# Patient Record
Sex: Female | Born: 1940 | Race: White | Hispanic: No | Marital: Married | Smoking: Former smoker
Health system: Southern US, Community
[De-identification: ages and names within clinical notes are randomized; demographics above are authoritative.]

## PROBLEM LIST (undated history)

## (undated) DIAGNOSIS — I10 Essential (primary) hypertension: Secondary | ICD-10-CM

## (undated) DIAGNOSIS — E785 Hyperlipidemia, unspecified: Secondary | ICD-10-CM

## (undated) DIAGNOSIS — Z853 Personal history of malignant neoplasm of breast: Secondary | ICD-10-CM

## (undated) HISTORY — DX: Personal history of malignant neoplasm of breast: Z85.3

## (undated) HISTORY — DX: Hyperlipidemia, unspecified: E78.5

## (undated) HISTORY — DX: Essential (primary) hypertension: I10

---

## 1978-07-28 HISTORY — PX: LAPAROSCOPIC HYSTERECTOMY: SHX1926

## 1979-07-29 HISTORY — PX: CORONARY ARTERY BYPASS GRAFT: SHX141

## 1986-07-28 HISTORY — PX: MASTECTOMY MODIFIED RADICAL: SUR848

## 2009-02-25 HISTORY — PX: MASTECTOMY: SHX3

## 2011-07-29 HISTORY — PX: BASAL CELL CARCINOMA EXCISION: SHX1214

## 2011-07-29 HISTORY — PX: RETINAL DETACHMENT SURGERY: SHX105

## 2012-01-22 DIAGNOSIS — Z85828 Personal history of other malignant neoplasm of skin: Secondary | ICD-10-CM | POA: Insufficient documentation

## 2012-04-23 DIAGNOSIS — H35379 Puckering of macula, unspecified eye: Secondary | ICD-10-CM | POA: Insufficient documentation

## 2012-10-08 LAB — LIPID PANEL
Cholesterol: 177 mg/dL (ref 0–200)
HDL: 79 mg/dL — AB (ref 35–70)
LDL Cholesterol: 87 mg/dL
Triglycerides: 56 mg/dL (ref 40–160)

## 2013-08-28 LAB — HM COLONOSCOPY: HM Colonoscopy: NORMAL

## 2013-11-03 LAB — TSH: TSH: 2.24 u[IU]/mL (ref <=5.90)

## 2013-11-03 LAB — BASIC METABOLIC PANEL
BUN: 20 mg/dL (ref 4–21)
Creatinine: 1.2 mg/dL — AB (ref <=1.1)
Glucose: 89 mg/dL

## 2014-05-17 DIAGNOSIS — I255 Ischemic cardiomyopathy: Secondary | ICD-10-CM | POA: Insufficient documentation

## 2014-05-17 DIAGNOSIS — I420 Dilated cardiomyopathy: Secondary | ICD-10-CM

## 2014-12-13 DIAGNOSIS — Z86010 Personal history of colonic polyps: Secondary | ICD-10-CM | POA: Insufficient documentation

## 2014-12-13 DIAGNOSIS — I255 Ischemic cardiomyopathy: Secondary | ICD-10-CM | POA: Insufficient documentation

## 2014-12-13 DIAGNOSIS — Z853 Personal history of malignant neoplasm of breast: Secondary | ICD-10-CM | POA: Insufficient documentation

## 2014-12-13 DIAGNOSIS — I2510 Atherosclerotic heart disease of native coronary artery without angina pectoris: Secondary | ICD-10-CM | POA: Insufficient documentation

## 2014-12-13 DIAGNOSIS — I10 Essential (primary) hypertension: Secondary | ICD-10-CM | POA: Insufficient documentation

## 2014-12-13 DIAGNOSIS — E785 Hyperlipidemia, unspecified: Secondary | ICD-10-CM | POA: Insufficient documentation

## 2014-12-13 DIAGNOSIS — N289 Disorder of kidney and ureter, unspecified: Secondary | ICD-10-CM | POA: Insufficient documentation

## 2014-12-13 DIAGNOSIS — M810 Age-related osteoporosis without current pathological fracture: Secondary | ICD-10-CM | POA: Insufficient documentation

## 2015-01-02 DIAGNOSIS — I890 Lymphedema, not elsewhere classified: Secondary | ICD-10-CM | POA: Diagnosis not present

## 2015-01-02 DIAGNOSIS — L905 Scar conditions and fibrosis of skin: Secondary | ICD-10-CM | POA: Diagnosis not present

## 2015-01-04 DIAGNOSIS — L905 Scar conditions and fibrosis of skin: Secondary | ICD-10-CM | POA: Diagnosis not present

## 2015-01-04 DIAGNOSIS — I890 Lymphedema, not elsewhere classified: Secondary | ICD-10-CM | POA: Diagnosis not present

## 2015-01-11 DIAGNOSIS — I890 Lymphedema, not elsewhere classified: Secondary | ICD-10-CM | POA: Diagnosis not present

## 2015-01-11 DIAGNOSIS — L905 Scar conditions and fibrosis of skin: Secondary | ICD-10-CM | POA: Diagnosis not present

## 2015-01-18 DIAGNOSIS — L905 Scar conditions and fibrosis of skin: Secondary | ICD-10-CM | POA: Diagnosis not present

## 2015-01-18 DIAGNOSIS — I890 Lymphedema, not elsewhere classified: Secondary | ICD-10-CM | POA: Diagnosis not present

## 2015-01-23 DIAGNOSIS — I890 Lymphedema, not elsewhere classified: Secondary | ICD-10-CM | POA: Diagnosis not present

## 2015-01-23 DIAGNOSIS — L905 Scar conditions and fibrosis of skin: Secondary | ICD-10-CM | POA: Diagnosis not present

## 2015-03-20 ENCOUNTER — Encounter: Payer: Self-pay | Admitting: Internal Medicine

## 2015-03-21 DIAGNOSIS — Z8739 Personal history of other diseases of the musculoskeletal system and connective tissue: Secondary | ICD-10-CM | POA: Diagnosis not present

## 2015-03-21 DIAGNOSIS — C50911 Malignant neoplasm of unspecified site of right female breast: Secondary | ICD-10-CM | POA: Diagnosis not present

## 2015-03-21 DIAGNOSIS — C50412 Malignant neoplasm of upper-outer quadrant of left female breast: Secondary | ICD-10-CM | POA: Diagnosis not present

## 2015-03-23 DIAGNOSIS — Z961 Presence of intraocular lens: Secondary | ICD-10-CM | POA: Diagnosis not present

## 2015-04-12 ENCOUNTER — Other Ambulatory Visit: Payer: Self-pay | Admitting: Internal Medicine

## 2015-06-08 ENCOUNTER — Encounter: Payer: Self-pay | Admitting: Internal Medicine

## 2015-06-08 ENCOUNTER — Ambulatory Visit (INDEPENDENT_AMBULATORY_CARE_PROVIDER_SITE_OTHER): Payer: Medicare PPO | Admitting: Internal Medicine

## 2015-06-08 ENCOUNTER — Other Ambulatory Visit: Payer: Self-pay | Admitting: Internal Medicine

## 2015-06-08 VITALS — BP 110/60 | HR 56 | Ht 63.0 in | Wt 150.8 lb

## 2015-06-08 DIAGNOSIS — I255 Ischemic cardiomyopathy: Secondary | ICD-10-CM

## 2015-06-08 DIAGNOSIS — I25118 Atherosclerotic heart disease of native coronary artery with other forms of angina pectoris: Secondary | ICD-10-CM

## 2015-06-08 DIAGNOSIS — N289 Disorder of kidney and ureter, unspecified: Secondary | ICD-10-CM

## 2015-06-08 DIAGNOSIS — M810 Age-related osteoporosis without current pathological fracture: Secondary | ICD-10-CM

## 2015-06-08 DIAGNOSIS — Z0000 Encounter for general adult medical examination without abnormal findings: Secondary | ICD-10-CM | POA: Diagnosis not present

## 2015-06-08 DIAGNOSIS — E785 Hyperlipidemia, unspecified: Secondary | ICD-10-CM | POA: Diagnosis not present

## 2015-06-08 DIAGNOSIS — R002 Palpitations: Secondary | ICD-10-CM

## 2015-06-08 HISTORY — DX: Palpitations: R00.2

## 2015-06-08 LAB — POCT URINALYSIS DIPSTICK
Bilirubin, UA: NEGATIVE
Blood, UA: NEGATIVE
GLUCOSE UA: NEGATIVE
KETONES UA: NEGATIVE
Leukocytes, UA: NEGATIVE
Nitrite, UA: NEGATIVE
PROTEIN UA: NEGATIVE
SPEC GRAV UA: 1.02
Urobilinogen, UA: 0.2
pH, UA: 5

## 2015-06-08 NOTE — Patient Instructions (Signed)
Health Maintenance  Topic Date Due  . INFLUENZA VACCINE  07/28/2018 (Originally 02/26/2015)  . ZOSTAVAX  07/28/2018 (Originally 11/24/2002)  . TETANUS/TDAP  07/28/2018 (Originally 11/23/1961)  . PNA vac Low Risk Adult (1 of 2 - PCV13) 07/28/2018 (Originally 11/24/2007)  . COLONOSCOPY  08/29/2023  . DEXA SCAN  Addressed

## 2015-06-08 NOTE — Progress Notes (Signed)
Patient: Kristen Blanchard, Female    DOB: [birth year], 74 y.o.   MRN: [id number] Visit Date: 06/08/2015  Today's Provider: Bari Edward, MD   Chief Complaint  Patient presents with  . Medicare Wellness   Subjective:    Annual wellness visit Kristen Blanchard is a 74 y.o. female who presents today for her Subsequent Annual Wellness Visit. She feels fairly well. She reports exercising none. She reports she is sleeping well.   ----------------------------------------------------------- Hypertension This is a chronic problem. The current episode started more than 1 year ago. The problem is unchanged. The problem is controlled. Associated symptoms include shortness of breath. Pertinent negatives include no chest pain, headaches or palpitations. There are no associated agents to hypertension. The current treatment provides significant improvement. There are no compliance problems.  Hypertensive end-organ damage includes kidney disease.  Hyperlipidemia This is a chronic problem. The current episode started more than 1 year ago. The problem is controlled. Associated symptoms include shortness of breath. Pertinent negatives include no chest pain or myalgias. Current antihyperlipidemic treatment includes statins. The current treatment provides significant improvement of lipids. There are no compliance problems.    Breast cancer- Patient has a history of bilateral breast cancer. First  mastectomy on the right 20 years ago. Cancer of the left breast 6 years ago. She is status post chemotherapy and radiation. She is on Arimidex; oncologist wants her to remain on medication for 10 years. She has side effects of hair loss, fatigue, hot flashes and sweats. Osteoporosis - Patient has been on Fosamax calcium and vitamin D for a number of years. Her last bone density was about 10 years ago. She has had several minor falls without fractures. 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 She has not required the use of sublingual nitroglycerin. She previously had an AICD but that was removed for radiation therapy. She sees her cardiologist on a regular basis and has had no recent medication changes. Her activity level remains limited due to fatigue.   . Review of Systems  Constitutional: Positive for diaphoresis and fatigue. Negative for fever and chills.  HENT: Negative for hearing loss, trouble swallowing and voice change.   Eyes: Negative for visual disturbance.  Respiratory: Positive for chest tightness and shortness of breath. Negative for cough and wheezing.   Cardiovascular: Negative for chest pain, palpitations and leg swelling.  Gastrointestinal: Positive for constipation and abdominal distention (after eating). Negative for nausea, vomiting and abdominal pain.  Endocrine: Negative for polydipsia and polyuria.  Genitourinary: Positive for urgency. Negative for dysuria, hematuria, flank pain, vaginal bleeding, vaginal discharge and vaginal pain.  Musculoskeletal: Positive for arthralgias. Negative for myalgias, joint swelling and gait problem.  Skin: Negative for color change and rash.       Hair loss   Allergic/Immunologic: Negative for food allergies.  Neurological: Negative for light-headedness, numbness and headaches.  Hematological: Negative for adenopathy. Does not bruise/bleed easily.  Psychiatric/Behavioral: Positive for dysphoric mood. Negative for sleep disturbance and decreased concentration.    Social History   Social History  . Marital Status: Married    Spouse Name: N/A  . Number of Children: N/A  . Years of Education: N/A   Occupational History  . Not on file.   Social History Main Topics  . Smoking status: Former Games developer  . Smokeless tobacco: Not on file  . Alcohol Use: No  . Drug Use: Not on file  . Sexual Activity: Not on file  Other Topics Concern  . Not on file   Social History Narrative     Patient Active Problem List   Diagnosis Date Noted  . Awareness of heartbeats 06/08/2015  . 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 Impaired renal function 12/13/2014  . Dyslipidemia 12/13/2014  . Essential (primary) hypertension 12/13/2014  . H/O adenomatous polyp of colon 12/13/2014  . H/O malignant neoplasm of breast 12/13/2014  . Cardiomyopathy, ischemic 12/13/2014  . OP (osteoporosis) 12/13/2014  . 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 Retinal detachment surgery  07/2011  . Mastectomy modified radical Right 07/1986  . Laparoscopic hysterectomy    . Coronary artery bypass graft  07/1979  . Basal cell carcinoma excision  07/2011  . Mastectomy Left 02/2009    Her family history includes Cancer in her father and mother.    Previous Medications   ALENDRONATE (FOSAMAX) 70 MG TABLET    TAKE 1 TABLET BY MOUTH EVERY WEEK   ANASTROZOLE (ARIMIDEX) 1 MG TABLET    Take 1 tablet by mouth daily.   ASPIRIN 81 MG CHEWABLE TABLET    Chew 1 tablet by mouth daily.   CALCIUM-VITAMIN D-VITAMIN K [phone] MG-UNT-MCG CHEW    Chew 1 tablet by mouth daily.   LISINOPRIL (PRINIVIL,ZESTRIL) 20 MG TABLET    Take 1 tablet by mouth daily.   METOPROLOL TARTRATE (LOPRESSOR) 25 MG TABLET    Take 1 tablet by mouth 2 (two) times daily.   METRONIDAZOLE (METROCREAM) 0.75 % CREAM    APPLY TOPICALLY TWO (2) TIMES A DAY.   NITROGLYCERIN (NITROSTAT) 0.4 MG SL TABLET    Place 1 tablet under the tongue as needed.   OXISTAT 1 % LOTION    APPLY TWICE A DAY TO NAILS   QUINIDINE SULFATE 300 MG TABLET    Take 1 tablet by mouth 2 (two) times daily.   ROSUVASTATIN (CRESTOR) 5 MG TABLET    Take 1 tablet by mouth 3 (three) times a week.     No care team member to display     Objective:   Vitals: BP 110/60 mmHg  Pulse 56  Ht 5\' 3"  (1.6 m)  Wt 150 lb 12.8 oz (68.402 kg)  BMI 26.72 kg/m2  Physical Exam  Constitutional: She is  oriented to person, place, and time. She appears well-developed. No distress.  HENT:  Head: Normocephalic and atraumatic.  Eyes: Conjunctivae are normal. Right eye exhibits no discharge. Left eye exhibits no discharge. No scleral icterus.  Neck: Normal range of motion. Neck supple. No thyromegaly present.  Cardiovascular: Normal rate, regular rhythm, normal heart sounds and intact distal pulses.   Pulmonary/Chest: Effort normal and breath sounds normal. No respiratory distress. She has no wheezes.  Bilateral mastectomies. Right mastectomy with reconstruction. Port-A-Cath site right upper chest. Surgical scar from AICD removal left upper chest. No skin change to suggest cancer recurrence.  Musculoskeletal: Normal range of motion. She exhibits no edema or tenderness.  Lymphadenopathy:    She has no cervical adenopathy.    She has no axillary adenopathy.  Neurological: She is alert and oriented to person, place, and time. She has normal reflexes.  Skin: Skin is warm and dry. No rash noted.  Psychiatric: She has a normal mood and affect. Her behavior is normal. Thought content normal.    Activities of Daily Living In your present state of health, do you have any difficulty performing the following activities: 06/08/2015  Hearing? N  Vision? N  Difficulty concentrating or making decisions? N  Walking or climbing stairs? Y  Dressing or bathing? N  Doing errands, shopping? N    Fall Risk Assessment Fall Risk  06/08/2015  Falls in the past year? Yes  Number falls in past yr: 2 or more  Injury with Fall? Yes  Follow up Education provided     Patient reports there are safety devices in place in shower at home.   Depression Screen PHQ 2/9 Scores 06/08/2015  PHQ - 2 Score 6  PHQ- 9 Score 11    Cognitive Testing - 6-CIT   Correct? Score   What year is it? yes 0 Yes = 0    No = 4  What month is it? yes 0 Yes = 0    No = 3  Remember:     [place]ins, [address], [place]     What  time is it? yes 0 Yes = 0    No = 3  Count backwards from 20 to 1 yes 0 Correct = 0    1 error = 2   More than 1 error = 4  Say the months of the year in reverse. yes 0 Correct = 0    1 error = 2   More than 1 error = 4  What address did I ask you to remember? yes 0 Correct = 0  1 error = 2    2 error = 4    3 error = 6    4 error = 8    All wrong = 10       TOTAL SCORE  0/28   Interpretation:  Normal  Normal (0-7) Abnormal (8-28)        Assessment & Plan:     Annual Wellness Visit  Reviewed patient's Family Medical History Reviewed and updated list of patient's medical providers Assessment of cognitive impairment was done Assessed patient's functional ability Established a written schedule for health screening services Health Risk Assessent Completed and Reviewed  Exercise Activities and Dietary recommendations Goals    . Exercise 3x per week (30 min per time)     Patient wants to find some activities outside the house.        There is no immunization history on file for this patient.  Health Maintenance  Topic Date Due  . INFLUENZA VACCINE  07/28/2018 (Originally 02/26/2015)  . ZOSTAVAX  07/28/2018 (Originally 11/24/2002)  . TETANUS/TDAP  07/28/2018 (Originally 11/23/1961)  . PNA vac Low Risk Adult (1 of 2 - PCV13) 07/28/2018 (Originally 11/24/2007)  . COLONOSCOPY  08/29/2023  . DEXA SCAN  Addressed     Discussed health benefits of physical activity, and encouraged her to engage in regular exercise appropriate for her age and condition.    ------------------------------------------------------------------------------------------------------------  1. Medicare annual wellness visit, subsequent Medicare wellness measures are satisfied Patient is not a candidate for mammograms due to bilateral mastectomies Patient declines all vaccines We discussed mood disorder and positive depression screen - most of this is due to social isolation and medical issues; will not  prescribe medication at this time  - POCT urinalysis dipstick  2. Cardiomyopathy, ischemic Stable; followed by cardiology AICD was removed for chemotherapy and radiation with no plans to replace it at this time - CBC with Differential/Platelet - TSH  3. Coronary artery disease of native artery of native heart with stable angina pectoris (HCC) Stable with minimal anginal symptoms not requiring nitroglycerin  therapy  4. Impaired renal function Continue avoidance of nonsteroidals for arthritis or headache Take Tylenol - Comprehensive metabolic panel  5. Dyslipidemia Continue statin therapy - Lipid panel  6. OP (osteoporosis) DEXA bone density test ordered at [facility] Continue Fosamax, calcium and vitamin D   Bari Edward, MD [facility]  06/08/2015

## 2015-06-09 LAB — COMPREHENSIVE METABOLIC PANEL
ALK PHOS: 47 IU/L (ref 39–117)
ALT: 12 IU/L (ref 0–32)
AST: 16 IU/L (ref 0–40)
Albumin/Globulin Ratio: 2.2 (ref 1.1–2.5)
Albumin: 4.2 g/dL (ref 3.5–4.8)
BILIRUBIN TOTAL: 0.5 mg/dL (ref 0.0–1.2)
BUN/Creatinine Ratio: 15 (ref 11–26)
BUN: 13 mg/dL (ref 8–27)
CO2: 26 mmol/L (ref 18–29)
Calcium: 9.8 mg/dL (ref 8.7–10.3)
Chloride: 98 mmol/L (ref 97–106)
Creatinine, Ser: 0.84 mg/dL (ref 0.57–1.00)
GFR calc Af Amer: 80 mL/min/{1.73_m2} (ref >59)
GFR calc non Af Amer: 70 mL/min/{1.73_m2} (ref >59)
GLOBULIN, TOTAL: 1.9 g/dL (ref 1.5–4.5)
GLUCOSE: 75 mg/dL (ref 65–99)
POTASSIUM: 4.3 mmol/L (ref 3.5–5.2)
SODIUM: 140 mmol/L (ref 136–144)
Total Protein: 6.1 g/dL (ref 6.0–8.5)

## 2015-06-09 LAB — CBC WITH DIFFERENTIAL/PLATELET
BASOS ABS: 0 10*3/uL (ref 0.0–0.2)
Basos: 0 %
EOS (ABSOLUTE): 0 10*3/uL (ref 0.0–0.4)
Eos: 0 %
HEMATOCRIT: 41 % (ref 34.0–46.6)
Hemoglobin: 14.1 g/dL (ref 11.1–15.9)
Immature Grans (Abs): 0 10*3/uL (ref 0.0–0.1)
Immature Granulocytes: 0 %
LYMPHS ABS: 1.4 10*3/uL (ref 0.7–3.1)
Lymphs: 26 %
MCH: 29.4 pg (ref 26.6–33.0)
MCHC: 34.4 g/dL (ref 31.5–35.7)
MCV: 85 fL (ref 79–97)
Monocytes Absolute: 0.5 10*3/uL (ref 0.1–0.9)
Monocytes: 8 %
NEUTROS ABS: 3.5 10*3/uL (ref 1.4–7.0)
Neutrophils: 66 %
Platelets: 175 10*3/uL (ref 150–379)
RBC: 4.8 x10E6/uL (ref 3.77–5.28)
RDW: 13.9 % (ref 12.3–15.4)
WBC: 5.4 10*3/uL (ref 3.4–10.8)

## 2015-06-09 LAB — LIPID PANEL
Chol/HDL Ratio: 2.2 ratio units (ref 0.0–4.4)
Cholesterol, Total: 195 mg/dL (ref 100–199)
HDL: 88 mg/dL (ref >39)
LDL Calculated: 92 mg/dL (ref 0–99)
Triglycerides: 75 mg/dL (ref 0–149)

## 2015-06-09 LAB — TSH: TSH: 1.9 u[IU]/mL (ref 0.450–4.500)

## 2015-06-20 DIAGNOSIS — E2839 Other primary ovarian failure: Secondary | ICD-10-CM | POA: Diagnosis not present

## 2015-06-20 DIAGNOSIS — M8589 Other specified disorders of bone density and structure, multiple sites: Secondary | ICD-10-CM | POA: Diagnosis not present

## 2015-06-20 DIAGNOSIS — C50412 Malignant neoplasm of upper-outer quadrant of left female breast: Secondary | ICD-10-CM | POA: Diagnosis not present

## 2015-06-20 DIAGNOSIS — Z95828 Presence of other vascular implants and grafts: Secondary | ICD-10-CM | POA: Diagnosis not present

## 2015-06-20 DIAGNOSIS — Z853 Personal history of malignant neoplasm of breast: Secondary | ICD-10-CM | POA: Diagnosis not present

## 2015-06-20 DIAGNOSIS — Z170 Estrogen receptor positive status [ER+]: Secondary | ICD-10-CM | POA: Diagnosis not present

## 2015-06-20 DIAGNOSIS — C50911 Malignant neoplasm of unspecified site of right female breast: Secondary | ICD-10-CM | POA: Diagnosis not present

## 2015-06-27 ENCOUNTER — Encounter: Payer: Self-pay | Admitting: Internal Medicine

## 2015-06-27 NOTE — Progress Notes (Unsigned)
Bone density scan shows normal bone density at the spine with only osteopenia at the other sites. This is an improvement from the last scan in 2008. Continue Fosamax and calcium and vitamin D.

## 2015-07-13 NOTE — Progress Notes (Signed)
Patient informed.dr 

## 2015-12-03 DIAGNOSIS — I340 Nonrheumatic mitral (valve) insufficiency: Secondary | ICD-10-CM | POA: Insufficient documentation

## 2015-12-26 ENCOUNTER — Other Ambulatory Visit: Payer: Self-pay | Admitting: Internal Medicine

## 2015-12-26 ENCOUNTER — Telehealth: Payer: Self-pay

## 2015-12-26 NOTE — Telephone Encounter (Signed)
Patient wants medicine called in for cough. I advised her to make appointment and she asked front staff again to call in med since she can not get in today. I advised [facility] if she can not wait but since she has not been in since Nov 2016 I declined us sending in RX and advised she needs OV.

## 2016-03-20 DIAGNOSIS — D649 Anemia, unspecified: Secondary | ICD-10-CM | POA: Insufficient documentation

## 2016-05-02 ENCOUNTER — Other Ambulatory Visit: Payer: Self-pay | Admitting: Internal Medicine

## 2016-06-13 ENCOUNTER — Encounter: Payer: Self-pay | Admitting: Internal Medicine

## 2016-06-13 ENCOUNTER — Ambulatory Visit (INDEPENDENT_AMBULATORY_CARE_PROVIDER_SITE_OTHER): Payer: Medicare Other | Admitting: Internal Medicine

## 2016-06-13 VITALS — BP 122/80 | HR 58 | Resp 16 | Ht 63.0 in | Wt 147.0 lb

## 2016-06-13 DIAGNOSIS — Z853 Personal history of malignant neoplasm of breast: Secondary | ICD-10-CM | POA: Diagnosis not present

## 2016-06-13 DIAGNOSIS — E785 Hyperlipidemia, unspecified: Secondary | ICD-10-CM

## 2016-06-13 DIAGNOSIS — N183 Chronic kidney disease, stage 3 unspecified: Secondary | ICD-10-CM

## 2016-06-13 DIAGNOSIS — Z0000 Encounter for general adult medical examination without abnormal findings: Secondary | ICD-10-CM

## 2016-06-13 DIAGNOSIS — I255 Ischemic cardiomyopathy: Secondary | ICD-10-CM

## 2016-06-13 DIAGNOSIS — I890 Lymphedema, not elsewhere classified: Secondary | ICD-10-CM

## 2016-06-13 DIAGNOSIS — N1832 Chronic kidney disease, stage 3b: Secondary | ICD-10-CM | POA: Insufficient documentation

## 2016-06-13 DIAGNOSIS — I10 Essential (primary) hypertension: Secondary | ICD-10-CM | POA: Diagnosis not present

## 2016-06-13 LAB — POCT URINALYSIS DIPSTICK
Bilirubin, UA: NEGATIVE
Blood, UA: NEGATIVE
GLUCOSE UA: NEGATIVE
KETONES UA: NEGATIVE
LEUKOCYTES UA: NEGATIVE
Nitrite, UA: NEGATIVE
PROTEIN UA: NEGATIVE
SPEC GRAV UA: 1.01
Urobilinogen, UA: 0.2
pH, UA: 6

## 2016-06-13 NOTE — Progress Notes (Signed)
Patient: Kristen Blanchard, Female    DOB: [birth year], 75 y.o.   MRN: [id number] Visit Date: 06/13/2016  Today's Provider: Bari EdwardLaura Hildagarde Holleran, MD   Chief Complaint  Patient presents with  . Medicare Wellness   Subjective:    Annual wellness visit Kristen Blanchard is a 75 y.o. female who presents today for her Subsequent Annual Wellness Visit. She feels fairly well. She reports exercising none. She reports she is sleeping poorly - due to back pain sleeps in the recliner. She has general aches and pains for which she takes tylenol.  ----------------------------------------------------------- Hyperlipidemia  This is a chronic problem. The problem is controlled. Recent lipid tests were reviewed and are normal. Associated symptoms include shortness of breath. Pertinent negatives include no chest pain. Current antihyperlipidemic treatment includes statins. The current treatment provides significant improvement of lipids. There are no compliance problems.   Cardiomyopathy - recent ECHO showed mod TR and mild MR/PR. Spironolactone added.  EF 20%.  Exercise tolerance is poor. She is followed by Dr. Sol BlazingHenke. Lymphedema/Breast cancer - has persistent mild edema of left arm. She wears a compression sleeve most of the time.  She has noticed lumps that come and go in her arm.  No redness or fever noted. Seen recently by Oncology - GFR was reduced and needs repeat. Osteoporosis - she is on Fosamax without side effects.  She has chronic mid back pain for which she takes tylenol.  Review of Systems  Constitutional: Negative for chills, fatigue and fever.  HENT: Negative for congestion, hearing loss, tinnitus, trouble swallowing and voice change.   Eyes: Negative for visual disturbance.  Respiratory: Positive for shortness of breath. Negative for cough, chest tightness and wheezing.   Cardiovascular: Negative for chest pain, palpitations and leg swelling.       Lymphedema in left arm  Gastrointestinal:  Positive for abdominal distention and nausea. Negative for abdominal pain, constipation, diarrhea and vomiting.  Endocrine: Negative for polydipsia and polyuria.  Genitourinary: Positive for vaginal discharge. Negative for dysuria, frequency, genital sores and vaginal bleeding.  Musculoskeletal: Positive for arthralgias. Negative for gait problem and joint swelling.  Skin: Negative for color change and rash.  Neurological: Negative for dizziness, tremors, light-headedness and headaches.  Hematological: Negative for adenopathy. Does not bruise/bleed easily.  Psychiatric/Behavioral: Negative for dysphoric mood and sleep disturbance. The patient is not nervous/anxious.     Social History   Social History  . Marital status: Married    Spouse name: N/A  . Number of children: N/A  . Years of education: N/A   Occupational History  . Not on file.   Social History Main Topics  . Smoking status: Former Games developermoker  . Smokeless tobacco: Never Used  . Alcohol use No  . Drug use: Unknown  . Sexual activity: Not on file   Other Topics Concern  . Not on file   Social History Narrative  . No narrative on file    Patient Active Problem List   Diagnosis Date Noted  . Chronic renal insufficiency, stage 3 (moderate) 06/13/2016  . Lymphedema of left upper extremity 06/13/2016  . Mitral regurgitation 12/03/2015  . Awareness of heartbeats 06/08/2015  . 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 Dyslipidemia 12/13/2014  . Essential (primary) hypertension 12/13/2014  . H/O adenomatous polyp of colon 12/13/2014  . H/O malignant neoplasm of breast 12/13/2014  . Cardiomyopathy, ischemic 12/13/2014  . OP (osteoporosis) 12/13/2014  . 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 BASAL CELL CARCINOMA EXCISION  07/2011  . 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 LAPAROSCOPIC HYSTERECTOMY    . MASTECTOMY Left 02/2009  .  MASTECTOMY MODIFIED RADICAL Right 07/1986  . RETINAL DETACHMENT SURGERY  07/2011    Her family history includes Cancer in her father and mother.     Previous Medications   ALENDRONATE (FOSAMAX) 70 MG TABLET    TAKE 1 TABLET BY MOUTH EVERY WEEK   ANASTROZOLE (ARIMIDEX) 1 MG TABLET    Take 1 tablet by mouth daily.   ASPIRIN 81 MG CHEWABLE TABLET    Chew by mouth.   BIOTIN 5 MG CAPS    Take by mouth.   CALCIUM-VITAMIN D-VITAMIN K [phone] MG-UNT-MCG CHEW    Chew 1 tablet by mouth daily.   COENZYME Q10 100 MG TABS    Take by mouth.   DOCUSATE CALCIUM (STOOL SOFTENER PO)    Take 50 mg by mouth.   HYDROCHLOROTHIAZIDE (HYDRODIURIL) 12.5 MG TABLET    Take 12.5 mg by mouth.   LISINOPRIL (PRINIVIL,ZESTRIL) 20 MG TABLET    Take 1 tablet by mouth daily.   METOPROLOL TARTRATE (LOPRESSOR) 25 MG TABLET    Take 1 tablet by mouth 2 (two) times daily.   NITROGLYCERIN (NITROSTAT) 0.4 MG SL TABLET    Place 1 tablet under the tongue as needed.   QUINIDINE SULFATE 300 MG TABLET    Take 1 tablet by mouth 2 (two) times daily.   ROSUVASTATIN (CRESTOR) 10 MG TABLET    Take by mouth.   SPIRONOLACTONE (ALDACTONE) 25 MG TABLET    Take 12.5 mg by mouth.    Patient Care Team: Reubin MilanLaura H Melane Windholz, MD as PCP - General (Internal Medicine) Vita ErmElizabeth Henke, MD as Referring Physician (Cardiology) Vanice Sarahobin L Hardie-Hood, MD (Inactive) as Referring Physician (Oncology)      Objective:   Vitals: BP 122/80   Pulse (!) 58   Resp 16   Ht 5\' 3"  (1.6 m)   Wt 147 lb (66.7 kg)   SpO2 98%   BMI 26.04 kg/m   Physical Exam  Constitutional: She is oriented to person, place, and time. She appears well-developed and well-nourished. No distress.  HENT:  Head: Normocephalic and atraumatic.  Right Ear: Tympanic membrane and ear canal normal.  Left Ear: Tympanic membrane and ear canal normal.  Nose: Right sinus exhibits no maxillary sinus tenderness. Left sinus exhibits no maxillary sinus tenderness.  Mouth/Throat: Uvula is  midline and oropharynx is clear and moist.  Eyes: Conjunctivae and EOM are normal. Right eye exhibits no discharge. Left eye exhibits no discharge. No scleral icterus.  Neck: Normal range of motion. Carotid bruit is not present. No erythema present. No thyromegaly present.  Cardiovascular: Normal rate, regular rhythm and normal pulses.  Exam reveals distant heart sounds. Exam reveals no gallop.   No murmur heard. Pulmonary/Chest: Effort normal. No respiratory distress. She has no wheezes.  Right mastectomy with reconstruction - no skin change or mass  Left mastectomy - skin without change or mass  Abdominal: Soft. Bowel sounds are normal. There is no hepatosplenomegaly. There is no tenderness. There is no CVA tenderness.  Musculoskeletal: Normal range of motion.       Right hip: She exhibits normal range of motion and normal strength.       Left hip: She exhibits normal range of motion and normal strength.       Right ankle: Normal.       Left  ankle: Normal.       Thoracic back: She exhibits deformity (mild scoliosis) and spasm.  Lymphadenopathy:    She has no cervical adenopathy.    She has no axillary adenopathy.  Edema of LUE - benign appearing edema with some mild loculation  Neurological: She is alert and oriented to person, place, and time. She has normal reflexes. No cranial nerve deficit or sensory deficit.  Skin: Skin is warm, dry and intact. No rash noted.  Psychiatric: She has a normal mood and affect. Her speech is normal and behavior is normal. Thought content normal.  Nursing note and vitals reviewed.   Activities of Daily Living In your present state of health, do you have any difficulty performing the following activities: 06/13/2016  Hearing? N  Vision? N  Difficulty concentrating or making decisions? N  Walking or climbing stairs? Y  Dressing or bathing? N  Doing errands, shopping? N  Preparing Food and eating ? N  Using the Toilet? N  In the past six months,  have you accidently leaked urine? N  Do you have problems with loss of bowel control? N  Managing your Medications? N  Managing your Finances? N  Housekeeping or managing your Housekeeping? Y  Some recent data might be hidden    Fall Risk Assessment Fall Risk  06/08/2015  Falls in the past year? Yes  Number falls in past yr: 2 or more  Injury with Fall? Yes  Follow up Education provided      Depression Screen PHQ 2/9 Scores 06/08/2015  PHQ - 2 Score 6  PHQ- 9 Score 11   6CIT Screen 06/13/2016  What Year? 0 points  What month? 0 points  What time? 0 points  Count back from 20 0 points  Months in reverse 0 points  Repeat phrase 0 points  Total Score 0     Medicare Annual Wellness Visit Summary:  Reviewed patient's Family Medical History Reviewed and updated list of patient's medical providers Assessment of cognitive impairment was done Assessed patient's functional ability Established a written schedule for health screening services Health Risk Assessent Completed and Reviewed  Exercise Activities and Dietary recommendations Goals    . Exercise 3x per week (30 min per time)          Patient wants to find some activities outside the house.    . Feel Better          Would like to feel better overall.         There is no immunization history on file for this patient.  Health Maintenance  Topic Date Due  . INFLUENZA VACCINE  07/28/2018 (Originally 02/26/2016)  . ZOSTAVAX  07/28/2018 (Originally 11/24/2002)  . TETANUS/TDAP  07/28/2018 (Originally 11/23/1961)  . PNA vac Low Risk Adult (1 of 2 - PCV13) 07/28/2018 (Originally 11/24/2007)  . COLONOSCOPY  08/29/2023  . DEXA SCAN  Addressed    Discussed health benefits of physical activity, and encouraged her to engage in regular exercise appropriate for her age and condition.    ------------------------------------------------------------------------------------------------------------  Assessment & Plan:  1.  Medicare annual wellness visit, subsequent Measures satisfied  2. Chronic renal insufficiency, stage 3 (moderate) Repeat today - may need to reduce dose of diuretic - Comprehensive metabolic panel - POCT urinalysis dipstick - TSH  3. Essential (primary) hypertension controlled  4. Cardiomyopathy, ischemic Followed by Cardiology  5. H/O malignant neoplasm of breast Stable - recently seen by Oncology Continues on Arimidex  6. Dyslipidemia On statin therapy  7. Lymphedema of left upper extremity Continue compression sleeve - pt reassured   Bari Edward, MD [facility]  06/13/2016

## 2016-06-13 NOTE — Patient Instructions (Signed)
Health Maintenance  Topic Date Due  . INFLUENZA VACCINE  07/28/2018 (Originally 02/26/2016)  . ZOSTAVAX  07/28/2018 (Originally 11/24/2002)  . TETANUS/TDAP  07/28/2018 (Originally 11/23/1961)  . PNA vac Low Risk Adult (1 of 2 - PCV13) 07/28/2018 (Originally 11/24/2007)  . COLONOSCOPY  08/29/2023  . DEXA SCAN  Addressed

## 2016-06-14 LAB — TSH: TSH: 1.29 u[IU]/mL (ref 0.450–4.500)

## 2016-06-14 LAB — COMPREHENSIVE METABOLIC PANEL
A/G RATIO: 1.6 (ref 1.2–2.2)
ALBUMIN: 4.3 g/dL (ref 3.5–4.8)
ALT: 18 IU/L (ref 0–32)
AST: 17 IU/L (ref 0–40)
Alkaline Phosphatase: 49 IU/L (ref 39–117)
BUN / CREAT RATIO: 20 (ref 12–28)
BUN: 23 mg/dL (ref 8–27)
Bilirubin Total: 0.6 mg/dL (ref 0.0–1.2)
CALCIUM: 10.2 mg/dL (ref 8.7–10.3)
CO2: 26 mmol/L (ref 18–29)
CREATININE: 1.14 mg/dL — AB (ref 0.57–1.00)
Chloride: 95 mmol/L — ABNORMAL LOW (ref 96–106)
GFR, EST AFRICAN AMERICAN: 55 mL/min/{1.73_m2} — AB (ref >59)
GFR, EST NON AFRICAN AMERICAN: 48 mL/min/{1.73_m2} — AB (ref >59)
GLOBULIN, TOTAL: 2.7 g/dL (ref 1.5–4.5)
Glucose: 95 mg/dL (ref 65–99)
POTASSIUM: 4.3 mmol/L (ref 3.5–5.2)
SODIUM: 137 mmol/L (ref 134–144)
Total Protein: 7 g/dL (ref 6.0–8.5)

## 2016-07-09 DIAGNOSIS — I5022 Chronic systolic (congestive) heart failure: Secondary | ICD-10-CM | POA: Insufficient documentation

## 2016-09-19 DIAGNOSIS — I972 Postmastectomy lymphedema syndrome: Secondary | ICD-10-CM | POA: Insufficient documentation

## 2016-10-31 DIAGNOSIS — Z95810 Presence of automatic (implantable) cardiac defibrillator: Secondary | ICD-10-CM | POA: Insufficient documentation

## 2017-04-08 ENCOUNTER — Telehealth: Payer: Self-pay | Admitting: Internal Medicine

## 2017-04-08 NOTE — Telephone Encounter (Signed)
Please call patient to let her know that the bone density shows some improvement in the spine but some loss in the hip for overall no change. Recommend repeated DEXA in 2 years.  Continue Alendronate.

## 2017-04-08 NOTE — Telephone Encounter (Signed)
Pt informed DEXA scan results per Dr Judithann GravesBerglund note. Told to continue alendronate.

## 2017-04-24 ENCOUNTER — Other Ambulatory Visit: Payer: Self-pay | Admitting: Internal Medicine

## 2017-05-27 DIAGNOSIS — R197 Diarrhea, unspecified: Secondary | ICD-10-CM | POA: Insufficient documentation

## 2017-06-08 ENCOUNTER — Encounter: Payer: Self-pay | Admitting: Internal Medicine

## 2017-06-08 ENCOUNTER — Other Ambulatory Visit: Payer: Self-pay | Admitting: Internal Medicine

## 2017-06-08 ENCOUNTER — Ambulatory Visit: Payer: Medicare Other | Admitting: Internal Medicine

## 2017-06-08 VITALS — BP 102/56 | HR 69 | Ht 63.0 in | Wt 148.0 lb

## 2017-06-08 DIAGNOSIS — N183 Chronic kidney disease, stage 3 unspecified: Secondary | ICD-10-CM

## 2017-06-08 DIAGNOSIS — D649 Anemia, unspecified: Secondary | ICD-10-CM | POA: Diagnosis not present

## 2017-06-08 DIAGNOSIS — K2900 Acute gastritis without bleeding: Secondary | ICD-10-CM

## 2017-06-08 DIAGNOSIS — I10 Essential (primary) hypertension: Secondary | ICD-10-CM

## 2017-06-08 MED ORDER — OMEPRAZOLE 40 MG PO CPDR: 40.0000 mg | DELAYED_RELEASE_CAPSULE | Freq: Every day | ORAL | 3 refills | Status: DC

## 2017-06-08 NOTE — Progress Notes (Signed)
Date:  06/08/2017   Name:  Kristen Blanchard   DOB:  July 23, 1941   MRN:  [id number]   Chief Complaint: Hospitalization Follow-up (Follow up from hospital stay. Patient is not feeling much better than before. Still feels very weak. Mo more diarrhea. Can't seem to get energy built back up. Constantly feeling like have nausea and morning sickness. ) [facility] 05/29/17 for low magnesium, diarrhea and AKI. Initial CR 1.3 - normalized to 1.0 at discharge.  Mild anemia developed with Hgb 10.0.  Mag slightly low. Since being home, she has being eating fairly well and drinking fluids with no further diarrhea and no vomiting.  She does not feel well however, still weak and constant mild nausea. Her discharge medications are unchanged.  She had low Mag that was supplemented in hospital but she was not sent home on supplements.  Review of Systems  Constitutional: Positive for fatigue. Negative for chills, fever and unexpected weight change.  Respiratory: Negative for cough, chest tightness and shortness of breath.   Cardiovascular: Negative for chest pain and leg swelling.  Gastrointestinal: Positive for nausea. Negative for abdominal pain, blood in stool, constipation and diarrhea.  Endocrine: Negative for polydipsia and polyuria.  Neurological: Negative for dizziness, light-headedness and headaches.  Psychiatric/Behavioral: Negative for sleep disturbance.    Patient Active Problem List   Diagnosis Date Noted  . Hypomagnesemia 05/27/2017  . Diarrhea with dehydration 05/27/2017  . 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 Chronic systolic HF (heart failure) (HCC) 07/09/2016  . Chronic renal insufficiency, stage 3 (moderate) (HCC) 06/13/2016  . Lymphedema of left upper extremity 06/13/2016  . Anemia 03/20/2016  . Mitral regurgitation 12/03/2015  . Awareness of heartbeats 06/08/2015  . 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 H/O osteoporosis 03/21/2015  . Dyslipidemia 12/13/2014  . Essential (primary) hypertension 12/13/2014  . H/O adenomatous polyp of colon 12/13/2014  . H/O malignant neoplasm of breast 12/13/2014  . Cardiomyopathy, ischemic 12/13/2014  . OP (osteoporosis) 12/13/2014  . 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 Hyperlipidemia 05/17/2014  . 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 Authorizing Provider  alendronate (FOSAMAX) 70 MG tablet TAKE 1 TABLET BY MOUTH EVERY WEEK 04/24/17  Yes Reubin MilanBerglund, Laura H, MD  anastrozole (ARIMIDEX) 1 MG tablet Take 1 tablet by mouth daily.   Yes [provider]  aspirin 81 MG chewable tablet Chew by mouth.   Yes [provider]  Biotin 5 MG CAPS Take by mouth.   Yes [provider]  Calcium-Vitamin D-Vitamin K [phone] MG-UNT-MCG CHEW Chew 1 tablet by mouth daily.   Yes [provider]  Coenzyme Q10 100 MG TABS Take by mouth.   Yes [provider]  Docusate Calcium (STOOL SOFTENER PO) Take 50 mg by mouth.   Yes [provider]  hydrochlorothiazide (HYDRODIURIL) 12.5 MG tablet Take 12.5 mg by mouth. 12/03/15 06/08/17 Yes [provider]  lisinopril (PRINIVIL,ZESTRIL) 20 MG tablet Take 1 tablet by mouth daily.   Yes [provider]  metoprolol tartrate (LOPRESSOR) 25 MG tablet Take 1 tablet by mouth 2 (two) times daily.   Yes [provider]  nitroGLYCERIN (NITROSTAT) 0.4 MG SL tablet Place 1 tablet under the tongue as needed.   Yes [provider]  quiNIDine sulfate 300 MG tablet Take 1 tablet by mouth 2 (two) times  daily.   Yes [provider]  rosuvastatin (CRESTOR) 10 MG tablet Take by mouth. 02/07/16 06/08/17 Yes [provider]  spironolactone (ALDACTONE) 25 MG tablet Take 12.5 mg by mouth. 12/11/15  Yes [provider]     Allergies  Allergen Reactions  . Atorvastatin     Other reaction(s): Other (See Comments) alopecia  . Deltasone [Prednisone] Other (See Comments)    Turned red after joint injection  . Simvastatin     Other reaction(s): Muscle Pain  . Tape     Other reaction(s): Unknown  . Codeine Nausea Only    Other reaction(s): UNKNOWN  . Quinolones Rash    Past Surgical History:  Procedure Laterality Date  . BASAL CELL CARCINOMA EXCISION  07/2011  . 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 LAPAROSCOPIC HYSTERECTOMY    . MASTECTOMY Left 02/2009  . MASTECTOMY MODIFIED RADICAL Right 07/1986  . RETINAL DETACHMENT SURGERY  07/2011    Social History   Tobacco Use  . Smoking status: Former Games developermoker  . Smokeless tobacco: Never Used  Substance Use Topics  . Alcohol use: No    Alcohol/week: 0.0 oz  . Drug use: Not on file     Medication list has been reviewed and updated.  PHQ 2/9 Scores 06/08/2017 06/08/2015  PHQ - 2 Score 0 6  PHQ- 9 Score - 11    Physical Exam  Constitutional: She is oriented to person, place, and time. She appears well-developed. She has a sickly appearance. No distress.  HENT:  Head: Normocephalic and atraumatic.  Eyes: Conjunctivae are normal.  Neck: Normal range of motion. Neck supple.  Cardiovascular: Normal rate, regular rhythm and intact distal pulses. Exam reveals distant heart sounds. Exam reveals no gallop.  Pulmonary/Chest: Effort normal and breath sounds normal. No respiratory distress. She has no wheezes.  Abdominal: Soft. Normal appearance. Bowel sounds are decreased. There is no tenderness. There is no rigidity, no guarding and no CVA tenderness.  Musculoskeletal: Normal range of motion.  Lymphadenopathy:  Lymphedema both arms L>R  Neurological: She is alert and oriented to person, place, and time.  Skin: Skin is warm and dry. No rash noted.     Psychiatric: She has a normal mood and affect. Her behavior is normal. Thought content normal.   Nursing note and vitals reviewed.   BP (!) 102/56   Pulse 69   Ht 5\' 3"  (1.6 m)   Wt 148 lb (67.1 kg)   SpO2 98%   BMI 26.22 kg/m   Assessment and Plan: 1. Other acute gastritis without hemorrhage Suspect stress induced gastritis - omeprazole (PRILOSEC) 40 MG capsule; Take 1 capsule (40 mg total) daily by mouth.  Dispense: 30 capsule; Refill: 3 - CBC with Differential/Platelet  2. Chronic renal insufficiency, stage 3 (moderate) (HCC) Check labs for stability Continue healthy diet and sufficient fluids - Comprehensive metabolic panel  3. Essential (primary) hypertension controlled - Comprehensive metabolic panel - TSH  4. Anemia, unspecified type Recheck and consider EGD if worsening - CBC with Differential/Platelet - Fe+TIBC+Fer  5. Hypomagnesemia Monitor for need to supplement - Magnesium   Meds ordered this encounter  Medications  . omeprazole (PRILOSEC) 40 MG capsule    Sig: Take 1 capsule (40 mg total) daily by mouth.    Dispense:  30 capsule    Refill:  3    Partially dictated using Animal nutritionistDragon software. Any errors are unintentional.  Bari EdwardLaura Berglund, MD [facility]  06/08/2017

## 2017-06-08 NOTE — Patient Instructions (Signed)
Start Omeprazole 40 mg once a day   Call back if nausea worsens

## 2017-06-09 ENCOUNTER — Other Ambulatory Visit: Payer: Self-pay | Admitting: Internal Medicine

## 2017-06-09 LAB — MAGNESIUM: Magnesium: 1.3 mg/dL — ABNORMAL LOW (ref 1.6–2.3)

## 2017-06-09 LAB — CBC WITH DIFFERENTIAL/PLATELET
BASOS ABS: 0 10*3/uL (ref 0.0–0.2)
Basos: 0 %
EOS (ABSOLUTE): 0 10*3/uL (ref 0.0–0.4)
Eos: 0 %
HEMOGLOBIN: 11.9 g/dL (ref 11.1–15.9)
Hematocrit: 36.2 % (ref 34.0–46.6)
IMMATURE GRANS (ABS): 0 10*3/uL (ref 0.0–0.1)
IMMATURE GRANULOCYTES: 0 %
LYMPHS: 24 %
Lymphocytes Absolute: 2.1 10*3/uL (ref 0.7–3.1)
MCH: 29.3 pg (ref 26.6–33.0)
MCHC: 32.9 g/dL (ref 31.5–35.7)
MCV: 89 fL (ref 79–97)
MONOCYTES: 9 %
Monocytes Absolute: 0.8 10*3/uL (ref 0.1–0.9)
NEUTROS ABS: 5.9 10*3/uL (ref 1.4–7.0)
Neutrophils: 67 %
Platelets: 200 10*3/uL (ref 150–379)
RBC: 4.06 x10E6/uL (ref 3.77–5.28)
RDW: 14.1 % (ref 12.3–15.4)
WBC: 8.8 10*3/uL (ref 3.4–10.8)

## 2017-06-09 LAB — IRON,TIBC AND FERRITIN PANEL
FERRITIN: 203 ng/mL — AB (ref 15–150)
IRON: 82 ug/dL (ref 27–139)
Iron Saturation: 29 % (ref 15–55)
Total Iron Binding Capacity: 286 ug/dL (ref 250–450)
UIBC: 204 ug/dL (ref 118–369)

## 2017-06-09 LAB — TSH: TSH: 0.659 u[IU]/mL (ref 0.450–4.500)

## 2017-06-09 LAB — COMPREHENSIVE METABOLIC PANEL
ALBUMIN: 4.5 g/dL (ref 3.5–4.8)
ALT: 23 IU/L (ref 0–32)
AST: 21 IU/L (ref 0–40)
Albumin/Globulin Ratio: 2 (ref 1.2–2.2)
Alkaline Phosphatase: 42 IU/L (ref 39–117)
BUN / CREAT RATIO: 19 (ref 12–28)
BUN: 35 mg/dL — AB (ref 8–27)
Bilirubin Total: 0.5 mg/dL (ref 0.0–1.2)
CALCIUM: 10.3 mg/dL (ref 8.7–10.3)
CO2: 26 mmol/L (ref 20–29)
CREATININE: 1.87 mg/dL — AB (ref 0.57–1.00)
Chloride: 96 mmol/L (ref 96–106)
GFR, EST AFRICAN AMERICAN: 30 mL/min/{1.73_m2} — AB (ref >59)
GFR, EST NON AFRICAN AMERICAN: 26 mL/min/{1.73_m2} — AB (ref >59)
GLUCOSE: 93 mg/dL (ref 65–99)
Globulin, Total: 2.3 g/dL (ref 1.5–4.5)
Potassium: 4.9 mmol/L (ref 3.5–5.2)
Sodium: 140 mmol/L (ref 134–144)
TOTAL PROTEIN: 6.8 g/dL (ref 6.0–8.5)

## 2017-06-09 MED ORDER — MAGNESIUM OXIDE 400 (241.3 MG) MG PO TABS: 400.0000 mg | ORAL_TABLET | Freq: Every day | ORAL | 0 refills | Status: DC

## 2017-06-25 ENCOUNTER — Ambulatory Visit (INDEPENDENT_AMBULATORY_CARE_PROVIDER_SITE_OTHER): Payer: Medicare Other | Admitting: Internal Medicine

## 2017-06-25 ENCOUNTER — Ambulatory Visit
Admission: RE | Admit: 2017-06-25 | Discharge: 2017-06-25 | Disposition: A | Discharge status: Home / Self Care | Payer: Medicare Other | Source: Ambulatory Visit | Attending: Internal Medicine | Admitting: Internal Medicine

## 2017-06-25 ENCOUNTER — Ambulatory Visit (INDEPENDENT_AMBULATORY_CARE_PROVIDER_SITE_OTHER): Payer: Medicare Other

## 2017-06-25 ENCOUNTER — Encounter: Payer: Self-pay | Admitting: Internal Medicine

## 2017-06-25 VITALS — BP 116/58 | HR 60 | Temp 97.5°F | Resp 16 | Ht 63.0 in | Wt 149.0 lb

## 2017-06-25 VITALS — BP 116/58 | HR 60 | Resp 16 | Ht 63.0 in | Wt 149.0 lb

## 2017-06-25 DIAGNOSIS — R0789 Other chest pain: Secondary | ICD-10-CM | POA: Diagnosis not present

## 2017-06-25 DIAGNOSIS — I10 Essential (primary) hypertension: Secondary | ICD-10-CM | POA: Diagnosis not present

## 2017-06-25 DIAGNOSIS — N183 Chronic kidney disease, stage 3 unspecified: Secondary | ICD-10-CM

## 2017-06-25 DIAGNOSIS — M25562 Pain in left knee: Secondary | ICD-10-CM | POA: Diagnosis not present

## 2017-06-25 DIAGNOSIS — I255 Ischemic cardiomyopathy: Secondary | ICD-10-CM | POA: Diagnosis not present

## 2017-06-25 DIAGNOSIS — M810 Age-related osteoporosis without current pathological fracture: Secondary | ICD-10-CM | POA: Diagnosis not present

## 2017-06-25 DIAGNOSIS — I420 Dilated cardiomyopathy: Secondary | ICD-10-CM

## 2017-06-25 DIAGNOSIS — Z0000 Encounter for general adult medical examination without abnormal findings: Secondary | ICD-10-CM

## 2017-06-25 LAB — POCT URINALYSIS DIPSTICK
Bilirubin, UA: NEGATIVE
Blood, UA: NEGATIVE
Glucose, UA: NEGATIVE
Ketones, UA: NEGATIVE
NITRITE UA: NEGATIVE
PH UA: 7 (ref 5.0–8.0)
PROTEIN UA: NEGATIVE
Spec Grav, UA: 1.015 (ref 1.010–1.025)
UROBILINOGEN UA: 0.2 U/dL

## 2017-06-25 MED ORDER — MAGNESIUM OXIDE 400 (241.3 MG) MG PO TABS: 400.0000 mg | ORAL_TABLET | Freq: Every day | ORAL | 2 refills | Status: DC

## 2017-06-25 NOTE — Progress Notes (Signed)
Date:  06/25/2017   Name:  Kristen Blanchard   DOB:  1940-08-21   MRN:  [id number]   Chief Complaint: Annual Exam (Breast Exam. ) Kristen Blanchard is a 76 y.o. female who presents today for her Complete Annual Exam. She feels fairly well. She reports exercising by walking some times. She reports she is sleeping fairly well. She has low energy - likely from both CM and anemia.    Gastroesophageal Reflux  She reports no abdominal pain, no chest pain, no coughing, no dysphagia, no heartburn, no sore throat or no wheezing. This is a chronic problem. The problem occurs rarely. Pertinent negatives include no fatigue. She has tried a PPI for the symptoms. The treatment provided significant relief.   Chest wall/rib pain - pain over left anterior lower ribs. There is also scar tissue from mastectomy and hx of rib fracture. It has been hurting for some time. Oncology did not seem concerned.  Low magnesium - started on supplements last month at admission for dehydration.  Drinking sufficient fluids now.  No myalgia or cramps.  Fatigue - stable sx with no cough, sputum, fever.    stable CM with EF 25%.  Has AICD in place.  Plans to see cardiology next month.  Renal insuff- slightly worse last visit.  Continue fluids.  Would like a recheck in 1-2 months along with magnesium  Knee pain - left knee aches and limits walking at times. Was told that she had a torn meniscus by UC.  No swelling or redness.  No weakness or numbness.  Cardiomyopathy with EF 25% - stable on beta blockers, spironolactone, NTG, aspirin and statin therapy.  Review of Systems  Constitutional: Negative for chills, fatigue and fever.  HENT: Negative for congestion, hearing loss, sore throat, tinnitus, trouble swallowing and voice change.   Eyes: Negative for visual disturbance.  Respiratory: Negative for cough, chest tightness, shortness of breath and wheezing.   Cardiovascular: Negative for chest pain, palpitations and leg  swelling.  Gastrointestinal: Negative for abdominal pain, constipation, diarrhea, dysphagia, heartburn and vomiting.  Endocrine: Negative for polydipsia and polyuria.  Genitourinary: Negative for dysuria, frequency, genital sores, vaginal bleeding and vaginal discharge.  Musculoskeletal: Positive for arthralgias (knee pain). Negative for gait problem and joint swelling.  Skin: Negative for color change and rash.  Neurological: Negative for dizziness, tremors, light-headedness and headaches.  Hematological: Negative for adenopathy. Does not bruise/bleed easily.  Psychiatric/Behavioral: Negative for dysphoric mood and sleep disturbance. The patient is not nervous/anxious.     Patient Active Problem List   Diagnosis Date Noted  . Hypomagnesemia 05/27/2017  . Diarrhea with dehydration 05/27/2017  . 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 Chronic systolic HF (heart failure) (HCC) 07/09/2016  . Chronic renal insufficiency, stage 3 (moderate) (HCC) 06/13/2016  . Lymphedema of left upper extremity 06/13/2016  . Anemia 03/20/2016  . Mitral regurgitation 12/03/2015  . Awareness of heartbeats 06/08/2015  . 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 H/O osteoporosis 03/21/2015  . Dyslipidemia 12/13/2014  . Essential (primary) hypertension 12/13/2014  . H/O adenomatous polyp of colon 12/13/2014  . H/O malignant neoplasm of breast 12/13/2014  . Cardiomyopathy, ischemic 12/13/2014  . OP (osteoporosis) 12/13/2014  . 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 Hyperlipidemia 05/17/2014  . 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 Authorizing Provider  alendronate (FOSAMAX) 70 MG  tablet TAKE 1 TABLET BY MOUTH EVERY WEEK 04/24/17   Reubin MilanBerglund, Dondre Catalfamo H, MD  anastrozole (ARIMIDEX) 1 MG tablet Take 1 tablet by mouth daily.    [provider]  aspirin 81 MG chewable tablet Chew by mouth.    [provider]  Biotin 5 MG CAPS Take by mouth.    [provider]  Calcium-Vitamin D-Vitamin K [phone] MG-UNT-MCG CHEW Chew 1 tablet by mouth daily.    [provider]  Coenzyme Q10 100 MG TABS Take by mouth.    [provider]  Docusate Calcium (STOOL SOFTENER PO) Take 50 mg by mouth.    [provider]  hydrochlorothiazide (HYDRODIURIL) 12.5 MG tablet Take 12.5 mg by mouth. 12/03/15 06/25/17  [provider]  lisinopril (PRINIVIL,ZESTRIL) 20 MG tablet Take 1 tablet by mouth daily.    [provider]  magnesium oxide (MAG-OX) 400 (241.3 Mg) MG tablet Take 1 tablet (400 mg total) daily by mouth. 06/09/17   Reubin MilanBerglund, Breianna Delfino H, MD  metoprolol tartrate (LOPRESSOR) 25 MG tablet Take 1 tablet by mouth 2 (two) times daily.    [provider]  nitroGLYCERIN (NITROSTAT) 0.4 MG SL tablet Place 1 tablet under the tongue as needed.    [provider]  omeprazole (PRILOSEC) 40 MG capsule Take 1 capsule (40 mg total) daily by mouth. 06/08/17   Reubin MilanBerglund, Kelsei Defino H, MD  quiNIDine sulfate 300 MG tablet Take 1 tablet by mouth 2 (two) times daily.    [provider]  rosuvastatin (CRESTOR) 10 MG tablet Take by mouth. 02/07/16 06/25/17  [provider]  spironolactone (ALDACTONE) 25 MG tablet Take 12.5 mg by mouth. 12/11/15   [provider]    Allergies  Allergen Reactions  . Atorvastatin     Other reaction(s): Other (See Comments) alopecia  . Deltasone [Prednisone] Other (See Comments)    Turned red after joint injection  . Simvastatin     Other reaction(s): Muscle Pain  . Tape     Other reaction(s): Unknown  . Codeine Nausea Only    Other reaction(s): UNKNOWN  . Quinolones Rash    Past Surgical History:  Procedure Laterality Date  . BASAL CELL CARCINOMA EXCISION  07/2011  . 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 LAPAROSCOPIC  HYSTERECTOMY    . MASTECTOMY Left 02/2009  . MASTECTOMY MODIFIED RADICAL Right 07/1986  . RETINAL DETACHMENT SURGERY  07/2011    Social History   Tobacco Use  . Smoking status: Former Smoker    Packs/day: 0.50    Years: 10.00    Pack years: 5.00    Types: Cigarettes  . Smokeless tobacco: Never Used  Substance Use Topics  . Alcohol use: No    Alcohol/week: 0.0 oz  . Drug use: No     Medication list has been reviewed and updated.  PHQ 2/9 Scores 06/25/2017 06/08/2017 06/08/2015  PHQ - 2 Score 0 0 6  PHQ- 9 Score - - 11    Physical Exam  Constitutional: She is oriented to person, place, and time. She appears well-developed and well-nourished. No distress.  HENT:  Head: Normocephalic and atraumatic.  Right Ear: Tympanic membrane and ear canal normal.  Left Ear: Tympanic membrane and ear canal normal.  Nose: Right sinus exhibits no maxillary sinus tenderness. Left sinus exhibits no maxillary sinus tenderness.  Mouth/Throat: Uvula is midline and oropharynx is clear and moist.  Eyes: Conjunctivae and EOM are normal. Right eye exhibits no discharge. Left eye exhibits no  discharge. No scleral icterus.  Neck: Normal range of motion. Carotid bruit is not present. No erythema present. No thyromegaly present.  Cardiovascular: Normal rate, regular rhythm, normal heart sounds, intact distal pulses and normal pulses.  Occasional extrasystoles are present.  No murmur heard. Pulmonary/Chest: Effort normal. No respiratory distress. She has no wheezes.    Bilateral mastectomies Left lower ribs tender, no obvious deformity but skin overlaying appears thickened not inflamed.  Abdominal: Soft. Bowel sounds are normal. There is no hepatosplenomegaly. There is no tenderness. There is no CVA tenderness.  Musculoskeletal: Normal range of motion. She exhibits edema (left arm).       Left knee: She exhibits normal range of motion, no swelling and no effusion. Tenderness found.  Lymphadenopathy:     She has no cervical adenopathy.    She has no axillary adenopathy.  Neurological: She is alert and oriented to person, place, and time. She has normal strength and normal reflexes. No cranial nerve deficit or sensory deficit.  Skin: Skin is warm, dry and intact. No rash noted.  Psychiatric: She has a normal mood and affect. Her speech is normal and behavior is normal. Thought content normal.  Nursing note and vitals reviewed.   BP (!) 116/58   Pulse 60   Resp 16   Ht 5\' 3"  (1.6 m)   Wt 149 lb (67.6 kg)   BMI 26.39 kg/m   Assessment and Plan: 1. Annual physical exam Completed with MAW earlier today  2. Left medial knee pain Mild; may need Ortho referral  3. Ischemic dilated cardiomyopathy (HCC) Stable fatigue- seeing Cardiology next month  4. Essential (primary) hypertension controlled  5. Left-sided chest wall pain CXR to rule out bony lesion - DG Ribs Unilateral Left; Future  6. Age-related osteoporosis without current pathological fracture Continue Fosamax  7. Chronic renal insufficiency, stage 3 (moderate) (HCC) Need to recheck in 1-2 months - POCT urinalysis dipstick  8. Hypomagnesemia - magnesium oxide (MAG-OX) 400 (241.3 Mg) MG tablet; Take 1 tablet (400 mg total) by mouth daily.  Dispense: 30 tablet; Refill: 2    Meds ordered this encounter  Medications  . magnesium oxide (MAG-OX) 400 (241.3 Mg) MG tablet    Sig: Take 1 tablet (400 mg total) by mouth daily.    Dispense:  30 tablet    Refill:  2    Partially dictated using Animal nutritionistDragon software. Any errors are unintentional.  Bari EdwardLaura Ronney [employer], MD [facility]  06/25/2017

## 2017-06-25 NOTE — Patient Instructions (Signed)
Need to have repeat Magnesium and Renal panel in 1-2 months (can ask Cardiology if they will do at next visit) otherwise return here.

## 2017-06-25 NOTE — Patient Instructions (Addendum)
Ms. Kristen Blanchard , Thank you for taking time to come for your Medicare Wellness Visit. I appreciate your ongoing commitment to your health goals. Please review the following plan we discussed and let me know if I can assist you in the future.   Screening recommendations/referrals: Colonoscopy: Completed 09/17/13. Repeat colonoscopy every 10 years Mammogram: Bilateral mastectomy. Bone Density: Completed 04/08/17. Osteoporotic screenings no longer required Recommended yearly ophthalmology/optometry visit for glaucoma screening and checkup Recommended yearly dental visit for hygiene and checkup  Vaccinations: Influenza vaccine: Declined Pneumococcal vaccine: Declined Tdap vaccine: Declined Shingles vaccine: Declined    Advanced directives: Advance directive discussed with you today. I have provided a copy for you to complete at home and have notarized. Once this is complete please bring a copy in to our office so we can scan it into your chart.  Conditions/risks identified: Fall risk prevention discussed; Recommend to exercise at least 150 minutes per week  Next appointment: You are scheduled to see Dr. Judithann GravesBerglund on 06/25/17 @ 10:30am.   Please schedule your annual wellness exam with your Nurse Health Advisor in one year.  Preventive Care 4165 Years and Older, Female Preventive care refers to lifestyle choices and visits with your health care provider that can promote health and wellness. What does preventive care include?  A yearly physical exam. This is also called an annual well check.  Dental exams once or twice a year.  Routine eye exams. Ask your health care provider how often you should have your eyes checked.  Personal lifestyle choices, including:  Daily care of your teeth and gums.  Regular physical activity.  Eating a healthy diet.  Avoiding tobacco and drug use.  Limiting alcohol use.  Practicing safe sex.  Taking low-dose aspirin every day.  Taking vitamin and  mineral supplements as recommended by your health care provider. What happens during an annual well check? The services and screenings done by your health care provider during your annual well check will depend on your age, overall health, lifestyle risk factors, and family history of disease. Counseling  Your health care provider may ask you questions about your:  Alcohol use.  Tobacco use.  Drug use.  Emotional well-being.  Home and relationship well-being.  Sexual activity.  Eating habits.  History of falls.  Memory and ability to understand (cognition).  Work and work Astronomerenvironment.  Reproductive health. Screening  You may have the following tests or measurements:  Height, weight, and BMI.  Blood pressure.  Lipid and cholesterol levels. These may be checked every 5 years, or more frequently if you are over 74 years old.  Skin check.  Lung cancer screening. You may have this screening every year starting at age 74 if you have a 30-pack-year history of smoking and currently smoke or have quit within the past 15 years.  Fecal occult blood test (FOBT) of the stool. You may have this test every year starting at age 74.  Flexible sigmoidoscopy or colonoscopy. You may have a sigmoidoscopy every 5 years or a colonoscopy every 10 years starting at age 10450.  Hepatitis C blood test.  Hepatitis B blood test.  Sexually transmitted disease (STD) testing.  Diabetes screening. This is done by checking your blood sugar (glucose) after you have not eaten for a while (fasting). You may have this done every 1-3 years.  Bone density scan. This is done to screen for osteoporosis. You may have this done starting at age 74.  Mammogram. This may be done every 1-2 years.  Talk to your health care provider about how often you should have regular mammograms. Talk with your health care provider about your test results, treatment options, and if necessary, the need for more tests. Vaccines    Your health care provider may recommend certain vaccines, such as:  Influenza vaccine. This is recommended every year.  Tetanus, diphtheria, and acellular pertussis (Tdap, Td) vaccine. You may need a Td booster every 10 years.  Zoster vaccine. You may need this after age 23.  Pneumococcal 13-valent conjugate (PCV13) vaccine. One dose is recommended after age 74.  Pneumococcal polysaccharide (PPSV23) vaccine. One dose is recommended after age 47. Talk to your health care provider about which screenings and vaccines you need and how often you need them. This information is not intended to replace advice given to you by your health care provider. Make sure you discuss any questions you have with your health care provider. Document Released: 08/10/2015 Document Revised: 04/02/2016 Document Reviewed: 05/15/2015 Elsevier Interactive Patient Education  2017 [place] Prevention in the Home Falls can cause injuries. They can happen to people of all ages. There are many things you can do to make your home safe and to help prevent falls. What can I do on the outside of my home?  Regularly fix the edges of walkways and driveways and fix any cracks.  Remove anything that might make you trip as you walk through a door, such as a raised step or threshold.  Trim any bushes or trees on the path to your home.  Use bright outdoor lighting.  Clear any walking paths of anything that might make someone trip, such as rocks or tools.  Regularly check to see if handrails are loose or broken. Make sure that both sides of any steps have handrails.  Any raised decks and porches should have guardrails on the edges.  Have any leaves, snow, or ice cleared regularly.  Use sand or salt on walking paths during winter.  Clean up any spills in your garage right away. This includes oil or grease spills. What can I do in the bathroom?  Use night lights.  Install grab bars by the toilet and in the  tub and shower. Do not use towel bars as grab bars.  Use non-skid mats or decals in the tub or shower.  If you need to sit down in the shower, use a plastic, non-slip stool.  Keep the floor dry. Clean up any water that spills on the floor as soon as it happens.  Remove soap buildup in the tub or shower regularly.  Attach bath mats securely with double-sided non-slip rug tape.  Do not have throw rugs and other things on the floor that can make you trip. What can I do in the bedroom?  Use night lights.  Make sure that you have a light by your bed that is easy to reach.  Do not use any sheets or blankets that are too big for your bed. They should not hang down onto the floor.  Have a firm chair that has side arms. You can use this for support while you get dressed.  Do not have throw rugs and other things on the floor that can make you trip. What can I do in the kitchen?  Clean up any spills right away.  Avoid walking on wet floors.  Keep items that you use a lot in easy-to-reach places.  If you need to reach something above you, use a strong step  stool that has a grab bar.  Keep electrical cords out of the way.  Do not use floor polish or wax that makes floors slippery. If you must use wax, use non-skid floor wax.  Do not have throw rugs and other things on the floor that can make you trip. What can I do with my stairs?  Do not leave any items on the stairs.  Make sure that there are handrails on both sides of the stairs and use them. Fix handrails that are broken or loose. Make sure that handrails are as long as the stairways.  Check any carpeting to make sure that it is firmly attached to the stairs. Fix any carpet that is loose or worn.  Avoid having throw rugs at the top or bottom of the stairs. If you do have throw rugs, attach them to the floor with carpet tape.  Make sure that you have a light switch at the top of the stairs and the bottom of the stairs. If you  do not have them, ask someone to add them for you. What else can I do to help prevent falls?  Wear shoes that:  Do not have high heels.  Have rubber bottoms.  Are comfortable and fit you well.  Are closed at the toe. Do not wear sandals.  If you use a stepladder:  Make sure that it is fully opened. Do not climb a closed stepladder.  Make sure that both sides of the stepladder are locked into place.  Ask someone to hold it for you, if possible.  Clearly mark and make sure that you can see:  Any grab bars or handrails.  First and last steps.  Where the edge of each step is.  Use tools that help you move around (mobility aids) if they are needed. These include:  Canes.  Walkers.  Scooters.  Crutches.  Turn on the lights when you go into a dark area. Replace any light bulbs as soon as they burn out.  Set up your furniture so you have a clear path. Avoid moving your furniture around.  If any of your floors are uneven, fix them.  If there are any pets around you, be aware of where they are.  Review your medicines with your doctor. Some medicines can make you feel dizzy. This can increase your chance of falling. Ask your doctor what other things that you can do to help prevent falls. This information is not intended to replace advice given to you by your health care provider. Make sure you discuss any questions you have with your health care provider. Document Released: 05/10/2009 Document Revised: 12/20/2015 Document Reviewed: 08/18/2014 Elsevier Interactive Patient Education  2017 [employer]

## 2017-06-25 NOTE — Progress Notes (Signed)
Subjective:   Kristen Blanchard is a 76 y.o. female who presents for Medicare Annual (Subsequent) preventive examination.  Review of Systems:  N/A Cardiac Risk Factors include: advanced age (>7355men, 68>65 women);hypertension;dyslipidemia;sedentary lifestyle     Objective:     Vitals: BP (!) 116/58 (BP Location: Right Arm, Patient Position: Sitting, Cuff Size: Normal)   Pulse 60   Temp (!) 97.5 F (36.4 C) (Oral)   Resp 16   Ht 5\' 3"  (1.6 m)   Wt 149 lb (67.6 kg)   BMI 26.39 kg/m   Body mass index is 26.39 kg/m.   Tobacco Social History   Tobacco Use  Smoking Status Former Smoker  . Packs/day: 0.50  . Years: 10.00  . Pack years: 5.00  . Types: Cigarettes  Smokeless Tobacco Never Used     Counseling given: No   Past Medical History:  Diagnosis Date  . History of breast cancer   . Hyperlipidemia   . Hypertension    Past Surgical History:  Procedure Laterality Date  . BASAL CELL CARCINOMA EXCISION  07/2011  . 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 LAPAROSCOPIC HYSTERECTOMY    . MASTECTOMY Left 02/2009  . MASTECTOMY MODIFIED RADICAL Right 07/1986  . RETINAL DETACHMENT SURGERY  07/2011   Family History  Problem Relation Age of Onset  . Cancer Mother        Breast  . Cancer Father        lung  . Rheum arthritis Brother   . Healthy Brother    Social History   Substance and Sexual Activity  Sexual Activity Not Currently    Outpatient Encounter Medications as of 06/25/2017  Medication Sig  . alendronate (FOSAMAX) 70 MG tablet TAKE 1 TABLET BY MOUTH EVERY WEEK  . anastrozole (ARIMIDEX) 1 MG tablet Take 1 tablet by mouth daily.  Marland Kitchen. aspirin 81 MG chewable tablet Chew by mouth.  . Biotin 5 MG CAPS Take by mouth.  . Calcium-Vitamin D-Vitamin K [phone] MG-UNT-MCG CHEW Chew 1 tablet by mouth daily.  . Coenzyme Q10 100 MG TABS Take by mouth.  Tery Sanfilippo. Docusate Calcium (STOOL SOFTENER PO) Take 50 mg by mouth.  . hydrochlorothiazide (HYDRODIURIL) 12.5 MG tablet  Take 12.5 mg by mouth.  Marland Kitchen. lisinopril (PRINIVIL,ZESTRIL) 20 MG tablet Take 1 tablet by mouth daily.  . magnesium oxide (MAG-OX) 400 (241.3 Mg) MG tablet Take 1 tablet (400 mg total) daily by mouth.  . metoprolol tartrate (LOPRESSOR) 25 MG tablet Take 1 tablet by mouth 2 (two) times daily.  . nitroGLYCERIN (NITROSTAT) 0.4 MG SL tablet Place 1 tablet under the tongue as needed.  Marland Kitchen. omeprazole (PRILOSEC) 40 MG capsule Take 1 capsule (40 mg total) daily by mouth.  . quiNIDine sulfate 300 MG tablet Take 1 tablet by mouth 2 (two) times daily.  . rosuvastatin (CRESTOR) 10 MG tablet Take by mouth.  . spironolactone (ALDACTONE) 25 MG tablet Take 12.5 mg by mouth.   No facility-administered encounter medications on file as of 06/25/2017.     Activities of Daily Living In your present state of health, do you have any difficulty performing the following activities: 06/25/2017  Hearing? N  Vision? N  Walking or climbing stairs? Y  Comment joint pain  Dressing or bathing? N  Doing errands, shopping? N  Preparing Food and eating ? N  Using the Toilet? N  In the past six months, have you accidently leaked urine? Y  Comment stress incontinence  Do you have problems with  loss of bowel control? N  Managing your Medications? N  Managing your Finances? N  Housekeeping or managing your Housekeeping? N  Some recent data might be hidden    Patient Care Team: Reubin MilanBerglund, Laura H, MD as PCP - General (Internal Medicine) Vita ErmHenke, Elizabeth, MD as Referring Physician (Cardiology) Hardie-Hood, Murvin Natalobin L, MD (Inactive) as Referring Physician (Oncology)    Assessment:     Exercise Activities and Dietary recommendations Current Exercise Habits: The patient does not participate in regular exercise at present, Exercise limited by: orthopedic condition(s)(knee pain)  Goals    . Exercise 150 min/wk Moderate Activity     Recommend to exercise at least 150 minutes per week    . Exercise 3x per week (30 min per  time)     Patient wants to find some activities outside the house.      Fall Risk Fall Risk  06/25/2017 06/08/2017 06/08/2015  Falls in the past year? No No Yes  Number falls in past yr: - - 2 or more  Injury with Fall? - - Yes  Follow up - - Education provided   Depression Screen PHQ 2/9 Scores 06/25/2017 06/08/2017 06/08/2015  PHQ - 2 Score 0 0 6  PHQ- 9 Score - - 11     Cognitive Function     6CIT Screen 06/25/2017 06/13/2016  What Year? 0 points 0 points  What month? 0 points 0 points  What time? 0 points 0 points  Count back from 20 0 points 0 points  Months in reverse 0 points 0 points  Repeat phrase 0 points 0 points  Total Score 0 0     There is no immunization history on file for this patient. Screening Tests Health Maintenance  Topic Date Due  . TETANUS/TDAP  06/25/2018 (Originally 11/23/1959)  . INFLUENZA VACCINE  07/28/2018 (Originally 02/25/2017)  . PNA vac Low Risk Adult (1 of 2 - PCV13) 07/28/2018 (Originally 11/24/2007)  . COLONOSCOPY  08/29/2023  . DEXA SCAN  Addressed      Plan:    I have personally reviewed and addressed the Medicare Annual Wellness questionnaire and have noted the following in the patient's chart:  A. Medical and social history B. Use of alcohol, tobacco or illicit drugs  C. Current medications and supplements D. Functional ability and status E.  Nutritional status F.  Physical activity G. Advance directives H. List of other physicians I.  Hospitalizations, surgeries, and ER visits in previous 12 months J.  Vitals K. Screenings such as hearing and vision if needed, cognitive and depression L. Referrals and appointments - none  In addition, I have reviewed and discussed with patient certain preventive protocols, quality metrics, and best practice recommendations. A written personalized care plan for preventive services as well as general preventive health recommendations were provided to patient.  See attached scanned  questionnaire for additional information.   Signed,  Deon PillingAmmie Itzayana Pardy, LPN Nurse Health Advisor  MD Recommendations: Declined all vaccines today

## 2017-08-04 ENCOUNTER — Other Ambulatory Visit: Payer: Self-pay

## 2017-08-04 ENCOUNTER — Telehealth: Payer: Self-pay

## 2017-08-04 NOTE — Telephone Encounter (Signed)
Patient called stating she seen cardiologist yesterday- Dr Sol BlazingHenke. Dr Sol BlazingHenke did not want to do any labs- but the patient requested to have magnesium levels checked. It came back that magnesium is still low. Dr Judithann GravesBerglund started prescribing the medication in the first place when magnesium was first low. Patient will now be taking 400 mg of magnesium twice daily and I scheduled her for f/up with Dr Judithann GravesBerglund and to have magnesium checked in 4 weeks. Pt is aware of date and time of appt.

## 2017-09-01 ENCOUNTER — Ambulatory Visit: Payer: Medicare Other | Admitting: Internal Medicine

## 2017-09-01 ENCOUNTER — Encounter: Payer: Self-pay | Admitting: Internal Medicine

## 2017-09-01 DIAGNOSIS — I255 Ischemic cardiomyopathy: Secondary | ICD-10-CM

## 2017-09-01 DIAGNOSIS — M898X9 Other specified disorders of bone, unspecified site: Secondary | ICD-10-CM

## 2017-09-01 DIAGNOSIS — I25118 Atherosclerotic heart disease of native coronary artery with other forms of angina pectoris: Secondary | ICD-10-CM | POA: Diagnosis not present

## 2017-09-01 DIAGNOSIS — I420 Dilated cardiomyopathy: Secondary | ICD-10-CM

## 2017-09-01 DIAGNOSIS — N183 Chronic kidney disease, stage 3 unspecified: Secondary | ICD-10-CM

## 2017-09-01 DIAGNOSIS — I10 Essential (primary) hypertension: Secondary | ICD-10-CM | POA: Diagnosis not present

## 2017-09-01 MED ORDER — MAGNESIUM OXIDE 400 (241.3 MG) MG PO TABS: 400.0000 mg | ORAL_TABLET | Freq: Every day | ORAL | 5 refills | Status: DC

## 2017-09-01 NOTE — Progress Notes (Signed)
Date:  09/01/2017   Name:  Kristen Blanchard   DOB:  12-08-1940   MRN:  [id number]   Chief Complaint: magnesium  Hypertension  This is a chronic problem. The problem is controlled. Associated symptoms include shortness of breath. Pertinent negatives include no chest pain, headaches or palpitations. Past treatments include ACE inhibitors and diuretics.   Low Mag - supplemented with twice a day.  Last level was still low.  She denies muscle cramps or spasm.  Bone pain - she is having discomfort in her shins.  Tender to touch the bone anteriorly.  No knee pain or swelling.  Some pain in feet as well.  She thinks this is aggravated by Arimidex therapy - now on year 8/10.  Renal insuff - noted to be slightly worse on last labs.  Encouraged increased fluids.  Pt does not take nsaids - only tylenol if needed.  CM - 20% EF with stable sx.  Seen last month by cardiology. No changes made to regimen.  ECHO ordered for late this year.   Review of Systems  Constitutional: Negative for chills, fatigue, fever and unexpected weight change.  Respiratory: Positive for shortness of breath. Negative for cough, chest tightness and wheezing.   Cardiovascular: Positive for leg swelling. Negative for chest pain and palpitations.  Genitourinary: Negative for dysuria.  Musculoskeletal: Positive for arthralgias and myalgias.  Allergic/Immunologic: Negative for environmental allergies.  Neurological: Negative for dizziness and headaches.  Psychiatric/Behavioral: Negative for dysphoric mood and sleep disturbance.    Patient Active Problem List   Diagnosis Date Noted  . Left medial knee pain 06/25/2017  . Left-sided chest wall pain 06/25/2017  . Hypomagnesemia 05/27/2017  . 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 Chronic systolic HF (heart failure) (HCC) 07/09/2016  . Chronic renal insufficiency, stage 3 (moderate) (HCC) 06/13/2016  . Lymphedema of  left upper extremity 06/13/2016  . Anemia 03/20/2016  . Mitral regurgitation 12/03/2015  . Awareness of heartbeats 06/08/2015  . 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 Dyslipidemia 12/13/2014  . Essential (primary) hypertension 12/13/2014  . H/O adenomatous polyp of colon 12/13/2014  . H/O malignant neoplasm of breast 12/13/2014  . Cardiomyopathy, ischemic 12/13/2014  . Osteoporosis without current pathological fracture 12/13/2014  . 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 Hyperlipidemia 05/17/2014  . 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 Authorizing Provider  alendronate (FOSAMAX) 70 MG tablet TAKE 1 TABLET BY MOUTH EVERY WEEK 04/24/17  Yes Reubin Milan, MD  anastrozole (ARIMIDEX) 1 MG tablet Take 1 tablet by mouth daily.   Yes [provider]  aspirin 81 MG chewable tablet Chew by mouth.   Yes [provider]  Biotin 5 MG CAPS Take by mouth.   Yes [provider]  Calcium-Vitamin D-Vitamin K [phone] MG-UNT-MCG CHEW Chew 1 tablet by mouth daily.   Yes [provider]  Coenzyme Q10 100 MG TABS Take by mouth.   Yes [provider]  Docusate Calcium (STOOL SOFTENER PO) Take 50 mg by mouth.   Yes [provider]  lisinopril (PRINIVIL,ZESTRIL) 20 MG tablet Take 1 tablet by mouth daily.   Yes [provider]  magnesium oxide (MAG-OX) 400 (241.3 Mg) MG tablet Take 1 tablet (400 mg total) by mouth daily. 06/25/17  Yes Reubin Milan, MD  metoprolol tartrate (LOPRESSOR) 25 MG tablet Take 1 tablet  by mouth 2 (two) times daily.   Yes [provider]  nitroGLYCERIN (NITROSTAT) 0.4 MG SL tablet Place 1 tablet under the tongue as needed.   Yes [provider]  omeprazole (PRILOSEC) 40 MG capsule Take 1 capsule (40 mg total) daily by mouth. 06/08/17  Yes Reubin Milan, MD  quiNIDine  sulfate 300 MG tablet Take 1 tablet by mouth 2 (two) times daily.   Yes [provider]  rosuvastatin (CRESTOR) 10 MG tablet Take by mouth. 02/07/16 09/01/17 Yes [provider]  spironolactone (ALDACTONE) 25 MG tablet Take 12.5 mg by mouth. 12/11/15  Yes [provider]  hydrochlorothiazide (HYDRODIURIL) 12.5 MG tablet Take 12.5 mg by mouth. 12/03/15 06/25/17  [provider]    Allergies  Allergen Reactions  . Atorvastatin     Other reaction(s): Other (See Comments) alopecia  . Deltasone [Prednisone] Other (See Comments)    Turned red after joint injection  . Simvastatin     Other reaction(s): Muscle Pain  . Tape     Other reaction(s): Unknown  . Codeine Nausea Only    Other reaction(s): UNKNOWN  . Quinolones Rash    Past Surgical History:  Procedure Laterality Date  . BASAL CELL CARCINOMA EXCISION  07/2011  . 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 LAPAROSCOPIC HYSTERECTOMY    . MASTECTOMY Left 02/2009  . MASTECTOMY MODIFIED RADICAL Right 07/1986  . RETINAL DETACHMENT SURGERY  07/2011    Social History   Tobacco Use  . Smoking status: Former Smoker    Packs/day: 0.50    Years: 10.00    Pack years: 5.00    Types: Cigarettes  . Smokeless tobacco: Never Used  Substance Use Topics  . Alcohol use: No    Alcohol/week: 0.0 oz  . Drug use: No     Medication list has been reviewed and updated.  PHQ 2/9 Scores 06/25/2017 06/08/2017 06/08/2015  PHQ - 2 Score 0 0 6  PHQ- 9 Score - - 11    Physical Exam  Constitutional: She is oriented to person, place, and time. She appears well-developed and well-nourished.  Neck: Normal range of motion. Neck supple. No thyromegaly present.  Cardiovascular: Normal rate and regular rhythm. Exam reveals gallop.  Pulmonary/Chest: Effort normal and breath sounds normal. She has no wheezes. She has no rales.  Musculoskeletal: She exhibits edema (trace ankle edema on left; lymphedema in LUE).  Tender to  touch both anterior shins Crepitus both knees without effusion  Neurological: She is alert and oriented to person, place, and time.  Skin: Skin is warm and dry.  Psychiatric: She has a normal mood and affect. Her behavior is normal. Thought content normal.    BP 116/78   Pulse 60   Ht 5\' 3"  (1.6 m)   Wt 149 lb 9.6 oz (67.9 kg)   SpO2 99%   BMI 26.50 kg/m   Assessment and Plan: 1. Hypomagnesemia Continue supplementation - magnesium oxide (MAG-OX) 400 (241.3 Mg) MG tablet; Take 1 tablet (400 mg total) by mouth daily.  Dispense: 60 tablet; Refill: 5 - Magnesium  2. Chronic renal insufficiency, stage 3 (moderate) (HCC) Monitor and advise if worsening - Comprehensive metabolic panel  3. Ischemic dilated cardiomyopathy (HCC) Stable; followed by cardiology  4. Coronary artery disease of native artery of native heart with stable angina pectoris (HCC) Stable on current regimen  5. Essential (primary) hypertension controlled  6. Bone pain Discuss Arimidex therapy with Oncology at next visit  Meds ordered this  encounter  Medications  . magnesium oxide (MAG-OX) 400 (241.3 Mg) MG tablet    Sig: Take 1 tablet (400 mg total) by mouth daily.    Dispense:  60 tablet    Refill:  5    Partially dictated using Animal nutritionistDragon software. Any errors are unintentional.  Bari EdwardLaura Dynastee Brummell, MD [facility]  09/01/2017

## 2017-09-02 LAB — COMPREHENSIVE METABOLIC PANEL
A/G RATIO: 1.8 (ref 1.2–2.2)
ALBUMIN: 4.4 g/dL (ref 3.5–4.8)
ALT: 22 IU/L (ref 0–32)
AST: 23 IU/L (ref 0–40)
Alkaline Phosphatase: 41 IU/L (ref 39–117)
BUN / CREAT RATIO: 19 (ref 12–28)
BUN: 30 mg/dL — ABNORMAL HIGH (ref 8–27)
Bilirubin Total: 0.4 mg/dL (ref 0.0–1.2)
CALCIUM: 10.2 mg/dL (ref 8.7–10.3)
CO2: 22 mmol/L (ref 20–29)
Chloride: 97 mmol/L (ref 96–106)
Creatinine, Ser: 1.59 mg/dL — ABNORMAL HIGH (ref 0.57–1.00)
GFR, EST AFRICAN AMERICAN: 37 mL/min/{1.73_m2} — AB (ref >59)
GFR, EST NON AFRICAN AMERICAN: 32 mL/min/{1.73_m2} — AB (ref >59)
GLOBULIN, TOTAL: 2.5 g/dL (ref 1.5–4.5)
Glucose: 97 mg/dL (ref 65–99)
POTASSIUM: 5 mmol/L (ref 3.5–5.2)
SODIUM: 138 mmol/L (ref 134–144)
TOTAL PROTEIN: 6.9 g/dL (ref 6.0–8.5)

## 2017-09-02 LAB — MAGNESIUM: Magnesium: 1.8 mg/dL (ref 1.6–2.3)

## 2017-09-04 ENCOUNTER — Other Ambulatory Visit: Payer: Self-pay

## 2017-09-04 MED ORDER — MAGNESIUM OXIDE 400 (241.3 MG) MG PO TABS: 400.0000 mg | ORAL_TABLET | Freq: Two times a day (BID) | ORAL | 5 refills | Status: DC

## 2017-09-27 ENCOUNTER — Other Ambulatory Visit: Payer: Self-pay | Admitting: Internal Medicine

## 2017-09-27 DIAGNOSIS — K2900 Acute gastritis without bleeding: Secondary | ICD-10-CM

## 2017-10-23 ENCOUNTER — Encounter: Payer: Self-pay | Admitting: Internal Medicine

## 2017-10-23 ENCOUNTER — Ambulatory Visit: Payer: Medicare Other | Admitting: Internal Medicine

## 2017-10-23 VITALS — BP 118/70 | HR 59 | Ht 63.0 in | Wt 151.0 lb

## 2017-10-23 DIAGNOSIS — N183 Chronic kidney disease, stage 3 unspecified: Secondary | ICD-10-CM

## 2017-10-23 DIAGNOSIS — M19072 Primary osteoarthritis, left ankle and foot: Secondary | ICD-10-CM

## 2017-10-23 DIAGNOSIS — I10 Essential (primary) hypertension: Secondary | ICD-10-CM | POA: Diagnosis not present

## 2017-10-23 NOTE — Progress Notes (Signed)
Date:  10/23/2017   Name:  Kristen Blanchard   DOB:  1941-04-26   MRN:  [id number]   Chief Complaint: Hypertension and Gastroesophageal Reflux (Getting ready to run out of Prilosec. Wanted to knw if she should cont taking or stop?)  Hypertension  This is a chronic problem. The problem is unchanged. The problem is controlled. Pertinent negatives include no chest pain or headaches. Identifiable causes of hypertension include chronic renal disease.  Gastroesophageal Reflux  She complains of abdominal pain (and diarrhea - meds started in November for possible ulcer). She reports no chest pain, no coughing or no wheezing. The problem has been resolved. Pertinent negatives include no fatigue. She has tried a PPI for the symptoms.   Ankle inflammation - seeing Ortho, on low dose prednisone and tapering slowly.  Her back is much better, her foot and ankle are slightly improved.  She could take low dose for quite a while without worry of complications.   Review of Systems  Constitutional: Negative for chills, fatigue and fever.  Respiratory: Negative for cough, chest tightness and wheezing.   Cardiovascular: Negative for chest pain and leg swelling.  Gastrointestinal: Positive for abdominal pain (and diarrhea - meds started in November for possible ulcer).  Musculoskeletal: Positive for arthralgias and back pain.  Neurological: Negative for dizziness and headaches.  Psychiatric/Behavioral: Negative for sleep disturbance.    Patient Active Problem List   Diagnosis Date Noted  . Bone pain 09/01/2017  . Left medial knee pain 06/25/2017  . Left-sided chest wall pain 06/25/2017  . Hypomagnesemia 05/27/2017  . 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 Chronic systolic HF (heart failure) (HCC) 07/09/2016  . Chronic renal insufficiency, stage 3 (moderate) (HCC) 06/13/2016  . Lymphedema of left upper extremity 06/13/2016  . Anemia 03/20/2016    . Mitral regurgitation 12/03/2015  . Awareness of heartbeats 06/08/2015  . 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 Dyslipidemia 12/13/2014  . Essential (primary) hypertension 12/13/2014  . H/O adenomatous polyp of colon 12/13/2014  . H/O malignant neoplasm of breast 12/13/2014  . Osteoporosis without current pathological fracture 12/13/2014  . 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 Hyperlipidemia 05/17/2014  . 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 Authorizing Provider  alendronate (FOSAMAX) 70 MG tablet TAKE 1 TABLET BY MOUTH EVERY WEEK 04/24/17  Yes Reubin Milan, MD  aspirin 81 MG chewable tablet Chew by mouth.   Yes [provider]  Biotin 5 MG CAPS Take by mouth.   Yes [provider]  Calcium-Vitamin D-Vitamin K [phone] MG-UNT-MCG CHEW Chew 1 tablet by mouth daily.   Yes [provider]  celecoxib (CELEBREX) 200 MG capsule Take 200 mg by mouth 2 (two) times daily.   Yes [provider]  Coenzyme Q10 100 MG TABS Take by mouth.   Yes [provider]  diclofenac sodium (VOLTAREN) 1 % GEL Apply topically 4 (four) times daily.   Yes [provider]  Docusate Calcium (STOOL SOFTENER PO) Take 50 mg by mouth.   Yes [provider]  hydrochlorothiazide (HYDRODIURIL) 12.5 MG tablet Take 12.5 mg by mouth. 12/03/15 10/23/17 Yes [provider]  lisinopril (PRINIVIL,ZESTRIL) 20 MG tablet Take 1 tablet by mouth daily.   Yes [provider]  magnesium oxide (MAG-OX) 400 (241.3 Mg) MG tablet Take 1 tablet (400 mg total) by mouth 2 (  two) times daily. 09/04/17  Yes Reubin MilanBerglund, Sagar Tengan H, MD  metoprolol tartrate (LOPRESSOR) 25 MG tablet Take 1 tablet by mouth 2 (two) times daily.   Yes [provider]  nitroGLYCERIN (NITROSTAT) 0.4 MG SL tablet Place 1 tablet under the tongue as needed.   Yes  [provider]  predniSONE (DELTASONE) 5 MG tablet Take 5 mg by mouth daily with breakfast.   Yes [provider]  quiNIDine sulfate 300 MG tablet Take 1 tablet by mouth 2 (two) times daily.   Yes [provider]  rosuvastatin (CRESTOR) 10 MG tablet Take by mouth. 02/07/16 10/23/17 Yes [provider]  spironolactone (ALDACTONE) 25 MG tablet Take 12.5 mg by mouth. 12/11/15  Yes [provider]  omeprazole (PRILOSEC) 40 MG capsule TAKE 1 CAPSULE (40 MG TOTAL) DAILY BY MOUTH. Patient not taking: Reported on 10/23/2017 09/28/17   Reubin MilanBerglund, Elany Felix H, MD    Allergies  Allergen Reactions  . Atorvastatin     Other reaction(s): Other (See Comments) alopecia  . Deltasone [Prednisone] Other (See Comments)    Turned red after joint injection  . Simvastatin     Other reaction(s): Muscle Pain  . Tape     Other reaction(s): Unknown  . Codeine Nausea Only    Other reaction(s): UNKNOWN  . Quinolones Rash    Past Surgical History:  Procedure Laterality Date  . BASAL CELL CARCINOMA EXCISION  07/2011  . 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 LAPAROSCOPIC HYSTERECTOMY    . MASTECTOMY Left 02/2009  . MASTECTOMY MODIFIED RADICAL Right 07/1986  . RETINAL DETACHMENT SURGERY  07/2011    Social History   Tobacco Use  . Smoking status: Former Smoker    Packs/day: 0.50    Years: 10.00    Pack years: 5.00    Types: Cigarettes  . Smokeless tobacco: Never Used  Substance Use Topics  . Alcohol use: No    Alcohol/week: 0.0 oz  . Drug use: No     Medication list has been reviewed and updated.  PHQ 2/9 Scores 06/25/2017 06/08/2017 06/08/2015  PHQ - 2 Score 0 0 6  PHQ- 9 Score - - 11    Physical Exam  Constitutional: She is oriented to person, place, and time. She appears well-developed. No distress.  HENT:  Head: Normocephalic and atraumatic.  Neck: Normal range of motion. Neck supple.  Cardiovascular: Normal rate, regular rhythm and normal heart  sounds.  Pulmonary/Chest: Effort normal. No respiratory distress. She has no wheezes. She has no rales.  Abdominal: Soft. There is no tenderness.  Musculoskeletal: She exhibits no edema.  Neurological: She is alert and oriented to person, place, and time.  Skin: Skin is warm and dry. No rash noted.  Psychiatric: She has a normal mood and affect. Her behavior is normal. Thought content normal.  Nursing note and vitals reviewed.   BP 118/70   Pulse (!) 59   Ht 5\' 3"  (1.6 m)   Wt 151 lb (68.5 kg)   SpO2 100%   BMI 26.75 kg/m   Assessment and Plan: 1. Essential (primary) hypertension controlled  2. Chronic renal insufficiency, stage 3 (moderate) (HCC) Stable Stop PPI Lab Results  Component Value Date   CREATININE 1.59 (H) 09/01/2017   BUN 30 (H) 09/01/2017   NA 138 09/01/2017   K 5.0 09/01/2017   CL 97 09/01/2017   CO2 22 09/01/2017     3. Inflammation of left ankle joint Continue low dose prednisone managed by Ortho  No orders of the defined types were placed in this encounter.   Partially dictated using Editor, commissioning. Any errors are unintentional.  Halina Maidens, MD [facility]  10/23/2017

## 2017-11-10 ENCOUNTER — Telehealth: Payer: Self-pay | Admitting: Internal Medicine

## 2017-11-10 NOTE — Telephone Encounter (Signed)
Reschedule awv ° °

## 2018-03-18 LAB — BASIC METABOLIC PANEL
BUN: 33 — AB (ref 4–21)
Creatinine: 2 — AB (ref 0.5–1.1)

## 2018-03-26 ENCOUNTER — Encounter: Payer: Self-pay | Admitting: Internal Medicine

## 2018-03-27 ENCOUNTER — Other Ambulatory Visit: Payer: Self-pay | Admitting: Internal Medicine

## 2018-04-05 ENCOUNTER — Telehealth: Payer: Self-pay

## 2018-04-05 NOTE — Telephone Encounter (Signed)
Patient called stating she passed out over the weekend while hugging her grandson. Had no signs or symptoms before it happened. No sweating, SOB, or chest pain. I did note from the chart that cardiology adjusted her defibrillator on the 23rd. And spoke with Dr Judithann Graves and she stated it would be best if the patient contact her cardiologist for advise.   She verbalized understanding. I told her if it happens again or if she has any sx of SOB or chest pain then to go to the ER.

## 2018-07-01 ENCOUNTER — Ambulatory Visit (INDEPENDENT_AMBULATORY_CARE_PROVIDER_SITE_OTHER): Payer: Medicare Other | Admitting: Internal Medicine

## 2018-07-01 ENCOUNTER — Ambulatory Visit: Payer: Self-pay

## 2018-07-01 ENCOUNTER — Encounter: Payer: Self-pay | Admitting: Internal Medicine

## 2018-07-01 VITALS — BP 120/82 | HR 74 | Ht 63.0 in | Wt 148.0 lb

## 2018-07-01 DIAGNOSIS — N183 Chronic kidney disease, stage 3 unspecified: Secondary | ICD-10-CM

## 2018-07-01 DIAGNOSIS — M810 Age-related osteoporosis without current pathological fracture: Secondary | ICD-10-CM

## 2018-07-01 DIAGNOSIS — I5022 Chronic systolic (congestive) heart failure: Secondary | ICD-10-CM | POA: Diagnosis not present

## 2018-07-01 DIAGNOSIS — E785 Hyperlipidemia, unspecified: Secondary | ICD-10-CM

## 2018-07-01 DIAGNOSIS — Z0000 Encounter for general adult medical examination without abnormal findings: Secondary | ICD-10-CM

## 2018-07-01 DIAGNOSIS — I10 Essential (primary) hypertension: Secondary | ICD-10-CM

## 2018-07-01 NOTE — Progress Notes (Signed)
Date:  07/01/2018   Name:  Kristen Blanchard   DOB:  31-May-1941   MRN:  [id number]   Chief Complaint: Annual Exam (breast exam) Kristen Blanchard is a 77 y.o. female who presents today for her Complete Annual Exam. She feels fairly well. She reports exercising very little. She reports she is sleeping fairly well.  No issues with breast reconstruction - no skin change. Hypertension  This is a chronic problem. The problem has been gradually improving since onset. The problem is controlled. Pertinent negatives include no chest pain, headaches, palpitations or shortness of breath. Past treatments include ACE inhibitors, beta blockers and diuretics. The current treatment provides significant improvement. Hypertensive end-organ damage includes kidney disease, CAD/MI and heart failure.  Hyperlipidemia  The problem is controlled. Pertinent negatives include no chest pain or shortness of breath. Current antihyperlipidemic treatment includes statins. The current treatment provides significant improvement of lipids.  Chronic systolic heart failure - followed by cardiology.  Had an episode of syncope being evaluated.  Lisinopril dose reduced.  ECHO done - unchanged.  To follow up with Card to discuss medication adjustment. CKD - taking only tylenol as needed, no nsaids.  No recent labs except CBC done in October.  [facility] 03/[facility] summary: Urinary bladder is normal in appearance. Uterus and ovaries are not well visualized in this examination and may be surgically absent.  Evaluation of the bones demonstrates no aggressive appearing sclerotic or lytic lesions.  Impression: 1. No evidence of metastatic disease within the chest, abdomen and Pelvis.  Review of Systems  Constitutional: Negative for chills, fatigue and fever.  HENT: Negative for congestion, hearing loss, tinnitus, trouble swallowing and voice change.   Eyes: Negative for visual disturbance.  Respiratory: Negative for cough, chest  tightness, shortness of breath and wheezing.   Cardiovascular: Negative for chest pain, palpitations and leg swelling.  Gastrointestinal: Negative for abdominal pain, constipation, diarrhea and vomiting.  Endocrine: Negative for polydipsia and polyuria.  Genitourinary: Positive for pelvic pain (had several days of RLQ pain now resolved; worried about ovarian cyst reported to her about 10 yr ago). Negative for dysuria, frequency, genital sores, menstrual problem, vaginal bleeding and vaginal discharge.  Musculoskeletal: Negative for arthralgias, gait problem and joint swelling.  Skin: Negative for color change and rash.  Allergic/Immunologic: Negative for environmental allergies and food allergies.  Neurological: Negative for dizziness, tremors, light-headedness and headaches.  Hematological: Negative for adenopathy. Does not bruise/bleed easily.  Psychiatric/Behavioral: Negative for dysphoric mood and sleep disturbance. The patient is not nervous/anxious.     Patient Active Problem List   Diagnosis Date Noted  . Bone pain 09/01/2017  . Left medial knee pain 06/25/2017  . Left-sided chest wall pain 06/25/2017  . Hypomagnesemia 05/27/2017  . 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 Chronic systolic HF (heart failure) (HCC) 07/09/2016  . Chronic renal insufficiency, stage 3 (moderate) (HCC) 06/13/2016  . Lymphedema of left upper extremity 06/13/2016  . Anemia 03/20/2016  . Mitral regurgitation 12/03/2015  . Awareness of heartbeats 06/08/2015  . 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 Dyslipidemia 12/13/2014  . Essential (primary) hypertension 12/13/2014  . H/O adenomatous polyp of colon 12/13/2014  . H/O malignant neoplasm of breast 12/13/2014  . Osteoporosis without current pathological fracture 12/13/2014  . 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 Hyperlipidemia 05/17/2014  . Cellophane  retinopathy 04/23/2012    Allergies  Allergen Reactions  . Atorvastatin  Other reaction(s): Other (See Comments) alopecia  . Deltasone [Prednisone] Other (See Comments)    Turned red after joint injection  . Simvastatin     Other reaction(s): Muscle Pain  . Tape     Other reaction(s): Unknown  . Codeine Nausea Only    Other reaction(s): UNKNOWN  . Quinolones Rash    Past Surgical History:  Procedure Laterality Date  . BASAL CELL CARCINOMA EXCISION  07/2011  . 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 LAPAROSCOPIC HYSTERECTOMY    . MASTECTOMY Left 02/2009  . MASTECTOMY MODIFIED RADICAL Right 07/1986  . RETINAL DETACHMENT SURGERY  07/2011    Social History   Tobacco Use  . Smoking status: Former Smoker    Packs/day: 0.50    Years: 10.00    Pack years: 5.00    Types: Cigarettes  . Smokeless tobacco: Never Used  Substance Use Topics  . Alcohol use: No    Alcohol/week: 0.0 standard drinks  . Drug use: No     Medication list has been reviewed and updated.  Current Meds  Medication Sig  . alendronate (FOSAMAX) 70 MG tablet TAKE 1 TABLET BY MOUTH ONE TIME PER WEEK  . aspirin 81 MG chewable tablet Chew by mouth.  . Biotin 5 MG CAPS Take by mouth.  . Calcium-Vitamin D-Vitamin K [phone] MG-UNT-MCG CHEW Chew 1 tablet by mouth daily.  . Coenzyme Q10 100 MG TABS Take by mouth.  Kristen Blanchard. Docusate Calcium (STOOL SOFTENER PO) Take 50 mg by mouth.  Marland Kitchen. lisinopril (PRINIVIL,ZESTRIL) 20 MG tablet Take 0.5 tablets by mouth daily.   . magnesium oxide (MAG-OX) 400 (241.3 Mg) MG tablet Take 1 tablet (400 mg total) by mouth 2 (two) times daily.  . metoprolol tartrate (LOPRESSOR) 25 MG tablet Take 1 tablet by mouth 2 (two) times daily.  . nitroGLYCERIN (NITROSTAT) 0.4 MG SL tablet Place 1 tablet under the tongue as needed.  . quiNIDine sulfate 300 MG tablet Take 1 tablet by mouth 2 (two) times daily.  . rosuvastatin (CRESTOR) 10 MG tablet Take by mouth.  . spironolactone (ALDACTONE) 25  MG tablet Take 12.5 mg by mouth.    PHQ 2/9 Scores 07/01/2018 06/25/2017 06/08/2017 06/08/2015  PHQ - 2 Score 0 0 0 6  PHQ- 9 Score 3 - - 11    Physical Exam  Constitutional: She is oriented to person, place, and time. She appears well-developed and well-nourished. No distress.  HENT:  Head: Normocephalic and atraumatic.  Right Ear: Tympanic membrane and ear canal normal.  Left Ear: Tympanic membrane and ear canal normal.  Nose: Right sinus exhibits no maxillary sinus tenderness. Left sinus exhibits no maxillary sinus tenderness.  Mouth/Throat: Uvula is midline and oropharynx is clear and moist.  Eyes: Conjunctivae and EOM are normal. Right eye exhibits no discharge. Left eye exhibits no discharge. No scleral icterus.  Neck: Normal range of motion. Carotid bruit is not present. No erythema present. No thyromegaly present.  Cardiovascular: Normal rate, regular rhythm, normal heart sounds and normal pulses.  Occasional extrasystoles are present.  Pulmonary/Chest: Effort normal. No respiratory distress. She has no wheezes.  Bilateral mastectomies Right reconstruction without skin nodules, redness or tenderness Left mastectomy site clear Porta cath in Right upper chest Pacemaker in Left upper chest  Abdominal: Soft. Bowel sounds are normal. There is no hepatosplenomegaly. There is no tenderness. There is no guarding and no CVA tenderness.  Musculoskeletal: Normal range of motion. She exhibits no edema or tenderness.  Lymphadenopathy:    She  has no cervical adenopathy.    She has no axillary adenopathy.  Neurological: She is alert and oriented to person, place, and time. She has normal strength and normal reflexes. No cranial nerve deficit or sensory deficit.  Skin: Skin is warm, dry and intact. No rash noted.  Psychiatric: She has a normal mood and affect. Her speech is normal and behavior is normal. Thought content normal.  Nursing note and vitals reviewed.   BP 120/82   Pulse 74    Ht 5\' 3"  (1.6 m)   Wt 148 lb (67.1 kg)   SpO2 98%   BMI 26.22 kg/m   Assessment and Plan: 1. Annual physical exam Continue healthy diet Return if RLQ pain recurs - CT makes no mention of ovarian abnormality Pt unable to void for UA  2. Essential (primary) hypertension Controlled on lower dose lisinopril - Comprehensive metabolic panel - TSH  3. Chronic renal insufficiency, stage 3 (moderate) (HCC) stable - Comprehensive metabolic panel  4. Dyslipidemia On crestor - Lipid panel  5. Chronic systolic HF (heart failure) (HCC) Unchanged by recent ECHO Follow up with cardiology re medication adjustment  6. Age-related osteoporosis without current pathological fracture Continue fosamax DEXA done 03/2017 Continue calcium and vitamin D   Partially dictated using Animal nutritionist. Any errors are unintentional.  Bari Edward, MD [facility]  07/01/2018

## 2018-07-01 NOTE — Patient Instructions (Signed)
Health Maintenance  Topic Date Due  . INFLUENZA VACCINE  07/28/2018 (Originally 02/25/2018)  . PNA vac Low Risk Adult (1 of 2 - PCV13) 07/28/2018 (Originally 11/24/2007)  . TETANUS/TDAP  07/02/2019 (Originally 11/23/1961)  . COLONOSCOPY  08/29/2023  . DEXA SCAN  Addressed

## 2018-07-02 LAB — COMPREHENSIVE METABOLIC PANEL
A/G RATIO: 2.2 (ref 1.2–2.2)
ALK PHOS: 46 IU/L (ref 39–117)
ALT: 16 IU/L (ref 0–32)
AST: 19 IU/L (ref 0–40)
Albumin: 4.6 g/dL (ref 3.5–4.8)
BILIRUBIN TOTAL: 0.6 mg/dL (ref 0.0–1.2)
BUN/Creatinine Ratio: 17 (ref 12–28)
BUN: 22 mg/dL (ref 8–27)
CO2: 25 mmol/L (ref 20–29)
Calcium: 10 mg/dL (ref 8.7–10.3)
Chloride: 97 mmol/L (ref 96–106)
Creatinine, Ser: 1.29 mg/dL — ABNORMAL HIGH (ref 0.57–1.00)
GFR calc Af Amer: 47 mL/min/{1.73_m2} — ABNORMAL LOW (ref >59)
GFR calc non Af Amer: 41 mL/min/{1.73_m2} — ABNORMAL LOW (ref >59)
GLUCOSE: 92 mg/dL (ref 65–99)
Globulin, Total: 2.1 g/dL (ref 1.5–4.5)
POTASSIUM: 4.5 mmol/L (ref 3.5–5.2)
Sodium: 138 mmol/L (ref 134–144)
TOTAL PROTEIN: 6.7 g/dL (ref 6.0–8.5)

## 2018-07-02 LAB — LIPID PANEL
CHOLESTEROL TOTAL: 183 mg/dL (ref 100–199)
Chol/HDL Ratio: 1.9 ratio (ref 0.0–4.4)
HDL: 94 mg/dL (ref >39)
LDL Calculated: 70 mg/dL (ref 0–99)
TRIGLYCERIDES: 96 mg/dL (ref 0–149)
VLDL CHOLESTEROL CAL: 19 mg/dL (ref 5–40)

## 2018-07-02 LAB — TSH: TSH: 1.04 u[IU]/mL (ref 0.450–4.500)

## 2018-08-14 IMAGING — CR DG RIBS 2V*L*
5 series · 5 of 5 positions shown · non-contrast
Comparison: None

CLINICAL DATA: Left-sided chest wall pain

EXAM:
LEFT RIBS - 2 VIEW

[rib pa (1 of 3)]
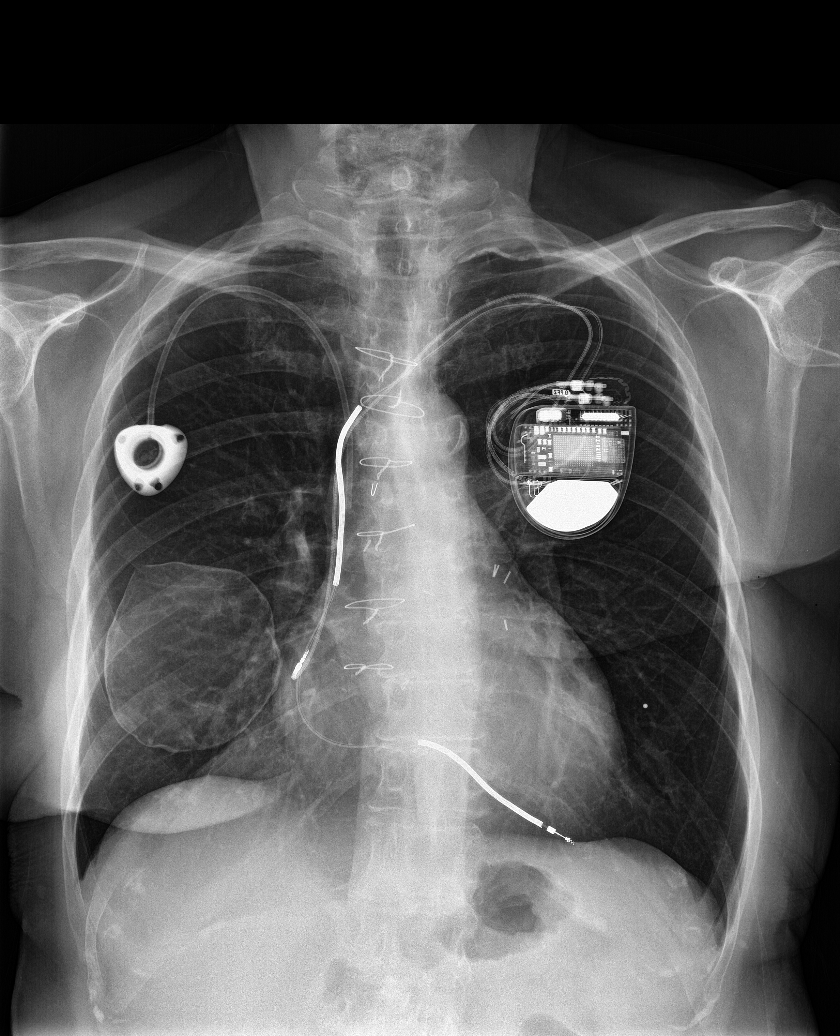

[rib pa (2 of 3)]
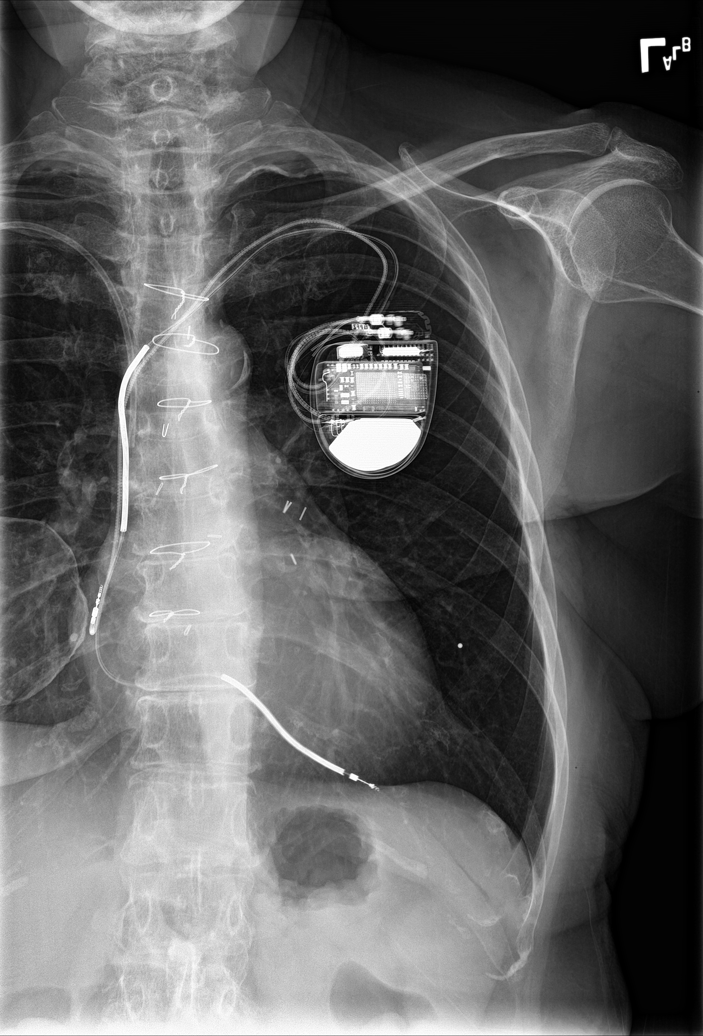

[rib pa (3 of 3)]
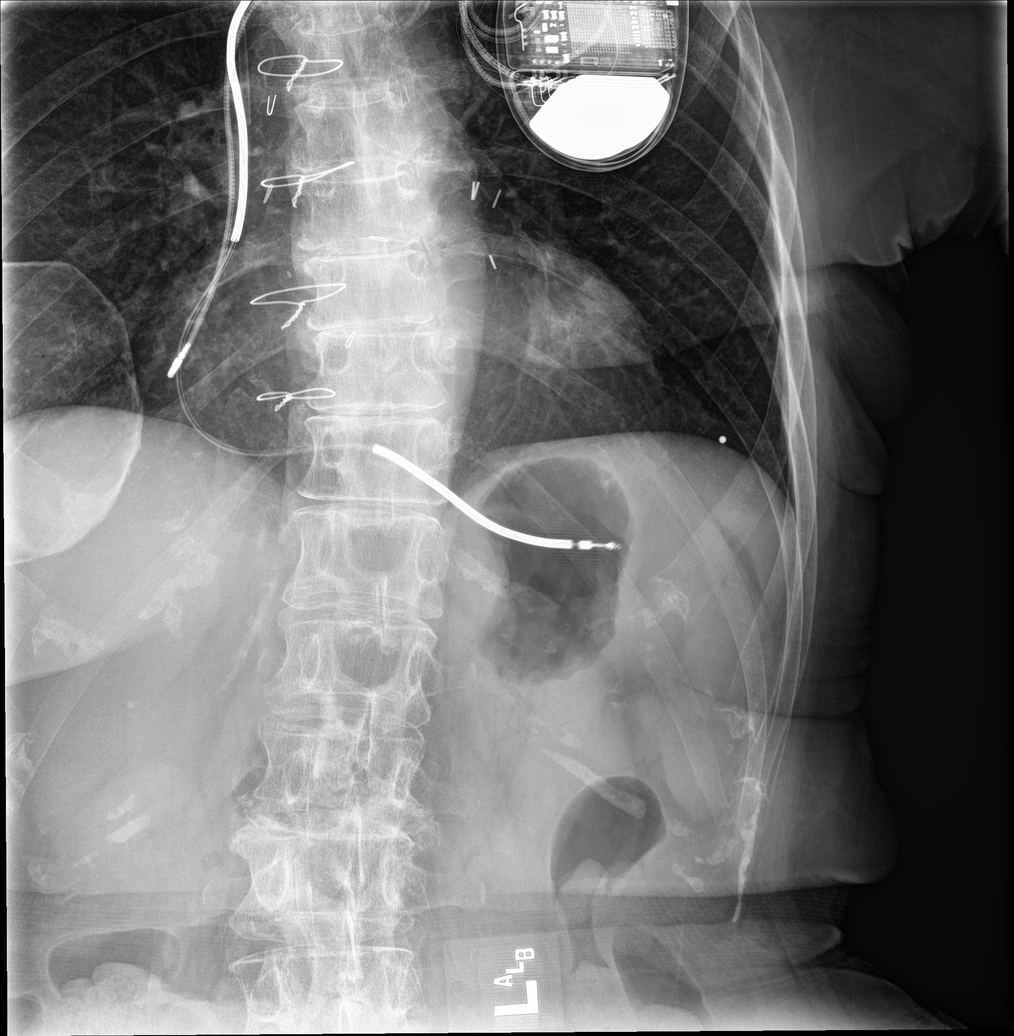

[rib obl (1 of 2)]
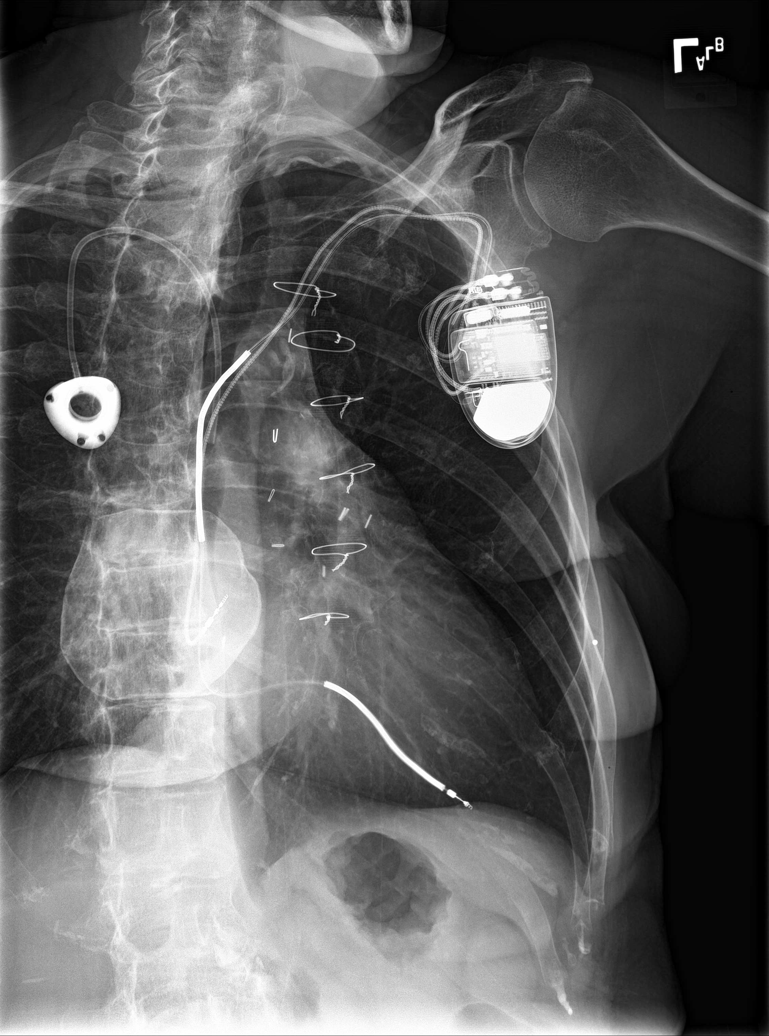

[rib obl (2 of 2)]
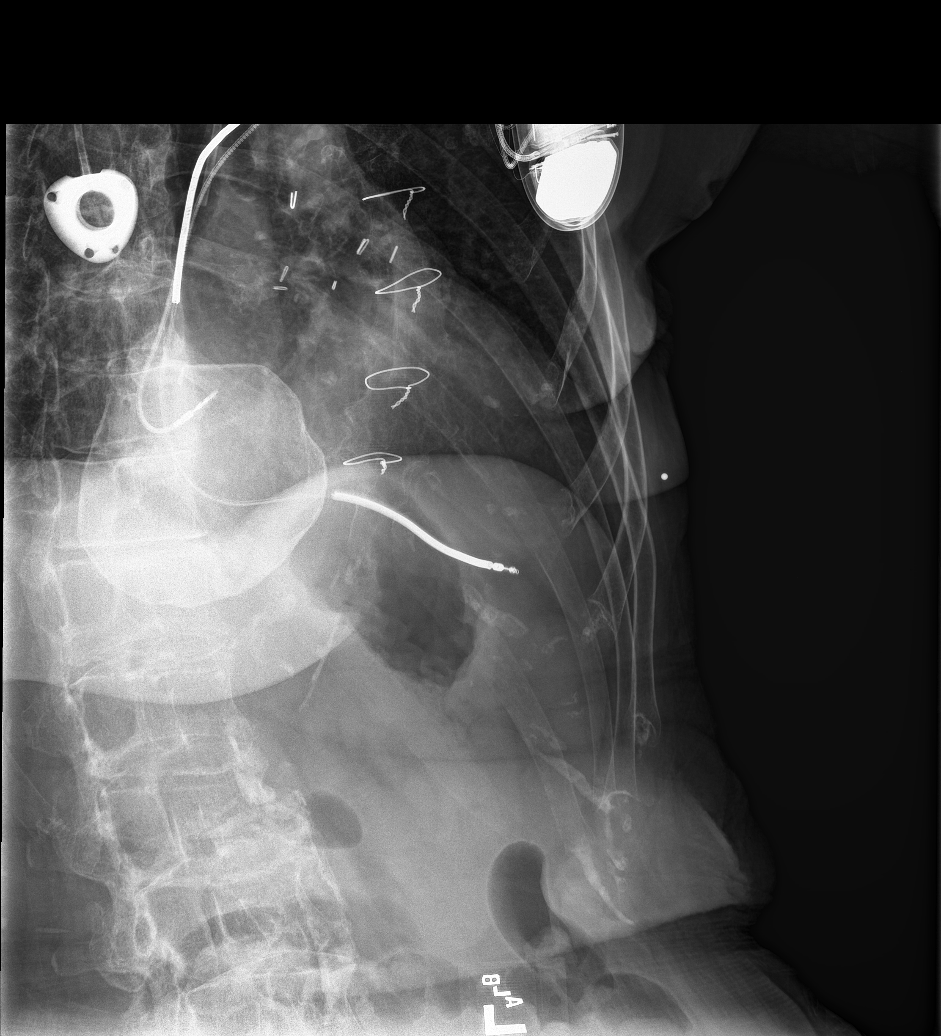

[5 of 5 positions shown; findings below may reference images not displayed]

FINDINGS: There is a right chest wall port a catheter with tip at the
cavoatrial junction. Left chest wall ICD is noted with leads in the
right atrial appendage and right ventricle. Breast implant overlies
the right chest. Normal heart size. Previous median sternotomy CABG
procedure. No pleural effusion or edema identified. No displaced rib
fractures identified.
IMPRESSION: 1. No acute cardiopulmonary abnormalities.
2. No displaced rib fractures identified.

## 2018-09-05 ENCOUNTER — Other Ambulatory Visit: Payer: Self-pay | Admitting: Internal Medicine

## 2019-02-20 ENCOUNTER — Other Ambulatory Visit: Payer: Self-pay | Admitting: Internal Medicine

## 2019-06-27 ENCOUNTER — Ambulatory Visit (INDEPENDENT_AMBULATORY_CARE_PROVIDER_SITE_OTHER): Payer: Medicare Other

## 2019-06-27 VITALS — BP 107/60 | HR 60 | Temp 97.6°F | Ht 63.0 in | Wt 150.0 lb

## 2019-06-27 DIAGNOSIS — Z0000 Encounter for general adult medical examination without abnormal findings: Secondary | ICD-10-CM

## 2019-06-27 NOTE — Progress Notes (Signed)
Subjective:   Kristen Blanchard is a 78 y.o. female who presents for Medicare Annual (Subsequent) preventive examination.  Virtual Visit via Telephone Note  I connected with Kristen Blanchard on 06/27/19 at 10:40 AM EST by telephone and verified that I am speaking with the correct person using two identifiers.  Medicare Annual Wellness visit completed telephonically due to Covid-19 pandemic.   Location: Patient: home Provider: office   I discussed the limitations, risks, security and privacy concerns of performing an evaluation and management service by telephone and the availability of in person appointments. The patient expressed understanding and agreed to proceed.  Some vital signs may be absent or patient reported.   Clemetine Marker, LPN    Review of Systems:   Cardiac Risk Factors include: advanced age (>59men, >63 women);dyslipidemia;hypertension;sedentary lifestyle     Objective:     Vitals: BP 107/60   Pulse 60   Temp 97.6 F (36.4 C)   Ht 5\' 3"  (1.6 m)   Wt 150 lb (68 kg)   SpO2 98%   BMI 26.57 kg/m   Body mass index is 26.57 kg/m.  Advanced Directives 06/27/2019 06/25/2017 06/13/2016  Does Patient Have a Medical Advance Directive? No Yes No  Does patient want to make changes to medical advance directive? - Yes (MAU/Ambulatory/Procedural Areas - Information given) -  Would patient like information on creating a medical advance directive? No - Patient declined - -    Tobacco Social History   Tobacco Use  Smoking Status Former Smoker  . Packs/day: 0.50  . Years: 10.00  . Pack years: 5.00  . Types: Cigarettes  Smokeless Tobacco Never Used     Counseling given: Not Answered   Clinical Intake:  Pre-visit preparation completed: Yes  Pain : 0-10 Pain Score: 5  Pain Type: Chronic pain Pain Location: Back(legs, feet) Pain Orientation: Lower, Right, Left Pain Descriptors / Indicators: Aching, Discomfort, Sore, Throbbing Pain Onset: More than a  month ago Pain Frequency: Constant     BMI - recorded: 26.57 Nutritional Status: BMI 25 -29 Overweight Nutritional Risks: None Diabetes: No  How often do you need to have someone help you when you read instructions, pamphlets, or other written materials from your doctor or pharmacy?: 1 - Never  Interpreter Needed?: No  Information entered by :: Clemetine Marker LPN  Past Medical History:  Diagnosis Date  . History of breast cancer   . Hyperlipidemia   . Hypertension    Past Surgical History:  Procedure Laterality Date  . BASAL CELL CARCINOMA EXCISION  07/2011  . 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 LAPAROSCOPIC HYSTERECTOMY  1980   partial  . MASTECTOMY Left 02/2009  . MASTECTOMY MODIFIED RADICAL Right 07/1986  . RETINAL DETACHMENT SURGERY  07/2011   Family History  Problem Relation Age of Onset  . Cancer Mother        Breast  . Cancer Father        lung  . Rheum arthritis Brother   . Healthy Brother    Social History   Socioeconomic History  . Marital status: Married    Spouse name: Not on file  . Number of children: 0  . Years of education: Not on file  . Highest education level: Not on file  Occupational History  . Occupation: Retired  Scientific laboratory technician  . Financial resource strain: Not hard at all  . Food insecurity    Worry: Never true    Inability: Never true  .  Transportation needs    Medical: No    Non-medical: No  Tobacco Use  . Smoking status: Former Smoker    Packs/day: 0.50    Years: 10.00    Pack years: 5.00    Types: Cigarettes  . Smokeless tobacco: Never Used  Substance and Sexual Activity  . Alcohol use: No    Alcohol/week: 0.0 standard drinks  . Drug use: No  . Sexual activity: Not Currently  Lifestyle  . Physical activity    Days per week: 0 days    Minutes per session: 0 min  . Stress: Not at all  Relationships  . Social connections    Talks on phone: More than three times a week    Gets together: Once a week    Attends  religious service: More than 4 times per year    Active member of club or organization: No    Attends meetings of clubs or organizations: Never    Relationship status: Married  Other Topics Concern  . Not on file  Social History Narrative  . Not on file    Outpatient Encounter Medications as of 06/27/2019  Medication Sig  . alendronate (FOSAMAX) 70 MG tablet TAKE 1 TABLET BY MOUTH ONE TIME PER WEEK  . aspirin 81 MG chewable tablet Chew by mouth.  . Biotin 5 MG CAPS Take by mouth.  . Calcium-Vitamin D-Vitamin K 7322021622-40 MG-UNT-MCG CHEW Chew 1 tablet by mouth daily.  . Coenzyme Q10 100 MG TABS Take by mouth.  Tery Sanfilippo Calcium (STOOL SOFTENER PO) Take 50 mg by mouth.  . furosemide (LASIX) 40 MG tablet Take 1 tablet by mouth daily. On Mondays and Fridays  . lisinopril (PRINIVIL,ZESTRIL) 20 MG tablet Take 0.5 tablets by mouth daily.   . magnesium oxide (MAG-OX) 400 MG tablet TAKE 1 TABLET BY MOUTH TWICE A DAY  . metoprolol tartrate (LOPRESSOR) 25 MG tablet Take 1 tablet by mouth 2 (two) times daily.  . nitroGLYCERIN (NITROSTAT) 0.4 MG SL tablet Place 1 tablet under the tongue as needed.  . quiNIDine sulfate 300 MG tablet Take 1 tablet by mouth 2 (two) times daily.  . rosuvastatin (CRESTOR) 20 MG tablet Take 20 mg by mouth daily.  Marland Kitchen spironolactone (ALDACTONE) 25 MG tablet Take 0.5 tablets by mouth daily.  . [DISCONTINUED] magnesium oxide (MAG-OX) 400 MG tablet Take 1 tablet by mouth 2 (two) times daily.  . [DISCONTINUED] rosuvastatin (CRESTOR) 10 MG tablet Take by mouth.  . [DISCONTINUED] spironolactone (ALDACTONE) 25 MG tablet Take 12.5 mg by mouth.   No facility-administered encounter medications on file as of 06/27/2019.     Activities of Daily Living In your present state of health, do you have any difficulty performing the following activities: 06/27/2019  Hearing? N  Comment declines hearing aids  Vision? N  Difficulty concentrating or making decisions? N  Walking or  climbing stairs? N  Dressing or bathing? N  Doing errands, shopping? N  Preparing Food and eating ? N  Using the Toilet? N  In the past six months, have you accidently leaked urine? Y  Comment wears liners for protection  Do you have problems with loss of bowel control? N  Managing your Medications? N  Managing your Finances? N  Housekeeping or managing your Housekeeping? N  Some recent data might be hidden    Patient Care Team: Reubin Milan, MD as PCP - General (Internal Medicine) Vita Erm, MD as Referring Physician (Cardiology) Hardie-Hood, Murvin Natal, MD (Inactive) as Referring Physician (  Oncology) Kurtis Bushman, MD as Referring Physician (Dermatology)    Assessment:   This is a routine wellness examination for [place].  Exercise Activities and Dietary recommendations Current Exercise Habits: The patient does not participate in regular exercise at present, Exercise limited by: orthopedic condition(s)  Goals    . Exercise 150 min/wk Moderate Activity     Recommend to exercise at least 150 minutes per week    . Exercise 3x per week (30 min per time)     Patient wants to find some activities outside the house.    . Feel Better     Would like to feel better overall.        Fall Risk Fall Risk  06/27/2019 07/01/2018 06/25/2017 06/08/2017 06/08/2015  Falls in the past year? 0 1 No No Yes  Comment - passed out but didn't hit floor - - -  Number falls in past yr: 0 0 - - 2 or more  Injury with Fall? 0 0 - - Yes  Risk for fall due to : Orthopedic patient - - - -  Follow up Falls prevention discussed Falls evaluation completed - - Education provided   FALL RISK PREVENTION PERTAINING TO THE HOME:  Any stairs in or around the home? Yes  If so, do they handrails? Yes   Home free of loose throw rugs in walkways, pet beds, electrical cords, etc? Yes  Adequate lighting in your home to reduce risk of falls? Yes   ASSISTIVE DEVICES UTILIZED TO PREVENT FALLS:   Life alert? No  Use of a cane, walker or w/c? No  Grab bars in the bathroom? Yes  Shower chair or bench in shower? No  Elevated toilet seat or a handicapped toilet? No   DME ORDERS:  DME order needed?  No   TIMED UP AND GO:  Was the test performed? No . Telephonic visit.   Education: Fall risk prevention has been discussed.  Intervention(s) required? No    Depression Screen PHQ 2/9 Scores 06/27/2019 07/01/2018 06/25/2017 06/08/2017  PHQ - 2 Score 0 0 0 0  PHQ- 9 Score - 3 - -     Cognitive Function     6CIT Screen 06/27/2019 06/25/2017 06/13/2016  What Year? 0 points 0 points 0 points  What month? 0 points 0 points 0 points  What time? 0 points 0 points 0 points  Count back from 20 0 points 0 points 0 points  Months in reverse 0 points 0 points 0 points  Repeat phrase 0 points 0 points 0 points  Total Score 0 0 0     There is no immunization history on file for this patient.  Qualifies for Shingles Vaccine? Yes  . Due for Shingrix. Education has been provided regarding the importance of this vaccine. Pt has been advised to call insurance company to determine out of pocket expense. Advised may also receive vaccine at local pharmacy or Health Dept. Verbalized acceptance and understanding.  Tdap: Although this vaccine is not a covered service during a Wellness Exam, does the patient still wish to receive this vaccine today?  No .  Education has been provided regarding the importance of this vaccine. Advised may receive this vaccine at local pharmacy or Health Dept. Aware to provide a copy of the vaccination record if obtained from local pharmacy or Health Dept. Verbalized acceptance and understanding.  Flu Vaccine: Due for Flu vaccine. Does the patient want to receive this vaccine today?  No . Education has been  provided regarding the importance of this vaccine but still declined. Advised may receive this vaccine at local pharmacy or Health Dept. Aware to provide a copy of  the vaccination record if obtained from local pharmacy or Health Dept. Verbalized acceptance and understanding.  Pneumococcal Vaccine: Due for Pneumococcal vaccine. Does the patient want to receive this vaccine today?  No . Education has been provided regarding the importance of this vaccine but still declined. Advised may receive this vaccine at local pharmacy or Health Dept. Aware to provide a copy of the vaccination record if obtained from local pharmacy or Health Dept. Verbalized acceptance and understanding.   Screening Tests Health Maintenance  Topic Date Due  . PNA vac Low Risk Adult (1 of 2 - PCV13) 11/24/2007  . INFLUENZA VACCINE  02/26/2019  . TETANUS/TDAP  07/02/2019 (Originally 11/23/1959)  . DEXA SCAN  Addressed    Cancer Screenings:  Colorectal Screening: Completed 09/20/13. Repeat every 5 years; pt states she has not been contact   Mammogram:  No longer required.   Bone Density: Completed 04/08/17. Results reflect  OSTEOPENIA. Repeat every 2 years. Patient to discuss at next visit due to current fosamax therapy for recommendations on timeline for next dexa scan.   Lung Cancer Screening: (Low Dose CT Chest recommended if Age 85-80 years, 30 pack-year currently smoking OR have quit w/in 15years.) does not qualify.   Additional Screening:  Hepatitis C Screening: no longer required  Vision Screening: Recommended annual ophthalmology exams for early detection of glaucoma and other disorders of the eye. Is the patient up to date with their annual eye exam?  Yes  Who is the provider or what is the name of the office in which the pt attends annual eye exams? Dr. Charlsie MerlesMark Newman  Dental Screening: Recommended annual dental exams for proper oral hygiene  Community Resource Referral:  CRR required this visit?  No      Plan:     I have personally reviewed and addressed the Medicare Annual Wellness questionnaire and have noted the following in the patient's chart:  A. Medical and  social history B. Use of alcohol, tobacco or illicit drugs  C. Current medications and supplements D. Functional ability and status E.  Nutritional status F.  Physical activity G. Advance directives H. List of other physicians I.  Hospitalizations, surgeries, and ER visits in previous 12 months J.  Vitals K. Screenings such as hearing and vision if needed, cognitive and depression L. Referrals and appointments   In addition, I have reviewed and discussed with patient certain preventive protocols, quality metrics, and best practice recommendations. A written personalized care plan for preventive services as well as general preventive health recommendations were provided to patient.   Signed,  Reather LittlerKasey Rhyanna Sorce, LPN Nurse Health Advisor   Nurse Notes: none

## 2019-06-27 NOTE — Patient Instructions (Signed)
Kristen Blanchard , Thank you for taking time to come for your Medicare Wellness Visit. I appreciate your ongoing commitment to your health goals. Please review the following plan we discussed and let me know if I can assist you in the future.   Screening recommendations/referrals: Colonoscopy: done 09/20/13. Please discuss at next visit. Bone Density: done 04/08/17. Please discuss at next visit.  Recommended yearly ophthalmology/optometry visit for glaucoma screening and checkup Recommended yearly dental visit for hygiene and checkup  Vaccinations: Influenza vaccine: postponed Pneumococcal vaccine: postponed Tdap vaccine: postponed Shingles vaccine: postponed    Advanced directives: Please bring a copy of your health care power of attorney and living will to the office at your convenience once you have completed that paperwork.   Conditions/risks identified: Recommend increasing physical activity as tolerated, including chair exercises.   Next appointment: Please follow up in one year for your Medicare Annual Wellness visit.     Preventive Care 52 Years and Older, Female Preventive care refers to lifestyle choices and visits with your health care provider that can promote health and wellness. What does preventive care include?  A yearly physical exam. This is also called an annual well check.  Dental exams once or twice a year.  Routine eye exams. Ask your health care provider how often you should have your eyes checked.  Personal lifestyle choices, including:  Daily care of your teeth and gums.  Regular physical activity.  Eating a healthy diet.  Avoiding tobacco and drug use.  Limiting alcohol use.  Practicing safe sex.  Taking low-dose aspirin every day.  Taking vitamin and mineral supplements as recommended by your health care provider. What happens during an annual well check? The services and screenings done by your health care provider during your annual well check  will depend on your age, overall health, lifestyle risk factors, and family history of disease. Counseling  Your health care provider may ask you questions about your:  Alcohol use.  Tobacco use.  Drug use.  Emotional well-being.  Home and relationship well-being.  Sexual activity.  Eating habits.  History of falls.  Memory and ability to understand (cognition).  Work and work Statistician.  Reproductive health. Screening  You may have the following tests or measurements:  Height, weight, and BMI.  Blood pressure.  Lipid and cholesterol levels. These may be checked every 5 years, or more frequently if you are over 69 years old.  Skin check.  Lung cancer screening. You may have this screening every year starting at age 18 if you have a 30-pack-year history of smoking and currently smoke or have quit within the past 15 years.  Fecal occult blood test (FOBT) of the stool. You may have this test every year starting at age 57.  Flexible sigmoidoscopy or colonoscopy. You may have a sigmoidoscopy every 5 years or a colonoscopy every 10 years starting at age 70.  Hepatitis C blood test.  Hepatitis B blood test.  Sexually transmitted disease (STD) testing.  Diabetes screening. This is done by checking your blood sugar (glucose) after you have not eaten for a while (fasting). You may have this done every 1-3 years.  Bone density scan. This is done to screen for osteoporosis. You may have this done starting at age 69.  Mammogram. This may be done every 1-2 years. Talk to your health care provider about how often you should have regular mammograms. Talk with your health care provider about your test results, treatment options, and if necessary, the  need for more tests. Vaccines  Your health care provider may recommend certain vaccines, such as:  Influenza vaccine. This is recommended every year.  Tetanus, diphtheria, and acellular pertussis (Tdap, Td) vaccine. You may  need a Td booster every 10 years.  Zoster vaccine. You may need this after age 64.  Pneumococcal 13-valent conjugate (PCV13) vaccine. One dose is recommended after age 22.  Pneumococcal polysaccharide (PPSV23) vaccine. One dose is recommended after age 24. Talk to your health care provider about which screenings and vaccines you need and how often you need them. This information is not intended to replace advice given to you by your health care provider. Make sure you discuss any questions you have with your health care provider. Document Released: 08/10/2015 Document Revised: 04/02/2016 Document Reviewed: 05/15/2015 Elsevier Interactive Patient Education  2017 [place] Prevention in the Home Falls can cause injuries. They can happen to people of all ages. There are many things you can do to make your home safe and to help prevent falls. What can I do on the outside of my home?  Regularly fix the edges of walkways and driveways and fix any cracks.  Remove anything that might make you trip as you walk through a door, such as a raised step or threshold.  Trim any bushes or trees on the path to your home.  Use bright outdoor lighting.  Clear any walking paths of anything that might make someone trip, such as rocks or tools.  Regularly check to see if handrails are loose or broken. Make sure that both sides of any steps have handrails.  Any raised decks and porches should have guardrails on the edges.  Have any leaves, snow, or ice cleared regularly.  Use sand or salt on walking paths during winter.  Clean up any spills in your garage right away. This includes oil or grease spills. What can I do in the bathroom?  Use night lights.  Install grab bars by the toilet and in the tub and shower. Do not use towel bars as grab bars.  Use non-skid mats or decals in the tub or shower.  If you need to sit down in the shower, use a plastic, non-slip stool.  Keep the floor  dry. Clean up any water that spills on the floor as soon as it happens.  Remove soap buildup in the tub or shower regularly.  Attach bath mats securely with double-sided non-slip rug tape.  Do not have throw rugs and other things on the floor that can make you trip. What can I do in the bedroom?  Use night lights.  Make sure that you have a light by your bed that is easy to reach.  Do not use any sheets or blankets that are too big for your bed. They should not hang down onto the floor.  Have a firm chair that has side arms. You can use this for support while you get dressed.  Do not have throw rugs and other things on the floor that can make you trip. What can I do in the kitchen?  Clean up any spills right away.  Avoid walking on wet floors.  Keep items that you use a lot in easy-to-reach places.  If you need to reach something above you, use a strong step stool that has a grab bar.  Keep electrical cords out of the way.  Do not use floor polish or wax that makes floors slippery. If you must use wax,  use non-skid floor wax.  Do not have throw rugs and other things on the floor that can make you trip. What can I do with my stairs?  Do not leave any items on the stairs.  Make sure that there are handrails on both sides of the stairs and use them. Fix handrails that are broken or loose. Make sure that handrails are as long as the stairways.  Check any carpeting to make sure that it is firmly attached to the stairs. Fix any carpet that is loose or worn.  Avoid having throw rugs at the top or bottom of the stairs. If you do have throw rugs, attach them to the floor with carpet tape.  Make sure that you have a light switch at the top of the stairs and the bottom of the stairs. If you do not have them, ask someone to add them for you. What else can I do to help prevent falls?  Wear shoes that:  Do not have high heels.  Have rubber bottoms.  Are comfortable and fit you  well.  Are closed at the toe. Do not wear sandals.  If you use a stepladder:  Make sure that it is fully opened. Do not climb a closed stepladder.  Make sure that both sides of the stepladder are locked into place.  Ask someone to hold it for you, if possible.  Clearly mark and make sure that you can see:  Any grab bars or handrails.  First and last steps.  Where the edge of each step is.  Use tools that help you move around (mobility aids) if they are needed. These include:  Canes.  Walkers.  Scooters.  Crutches.  Turn on the lights when you go into a dark area. Replace any light bulbs as soon as they burn out.  Set up your furniture so you have a clear path. Avoid moving your furniture around.  If any of your floors are uneven, fix them.  If there are any pets around you, be aware of where they are.  Review your medicines with your doctor. Some medicines can make you feel dizzy. This can increase your chance of falling. Ask your doctor what other things that you can do to help prevent falls. This information is not intended to replace advice given to you by your health care provider. Make sure you discuss any questions you have with your health care provider. Document Released: 05/10/2009 Document Revised: 12/20/2015 Document Reviewed: 08/18/2014 Elsevier Interactive Patient Education  2017 [employer].

## 2019-07-04 ENCOUNTER — Encounter: Payer: Self-pay | Admitting: Internal Medicine

## 2019-07-07 ENCOUNTER — Encounter: Payer: Self-pay | Admitting: Internal Medicine

## 2019-07-07 ENCOUNTER — Ambulatory Visit (INDEPENDENT_AMBULATORY_CARE_PROVIDER_SITE_OTHER): Payer: Medicare Other | Admitting: Internal Medicine

## 2019-07-07 ENCOUNTER — Other Ambulatory Visit: Payer: Self-pay

## 2019-07-07 VITALS — BP 124/72 | HR 61 | Ht 63.0 in | Wt 152.0 lb

## 2019-07-07 DIAGNOSIS — Z0000 Encounter for general adult medical examination without abnormal findings: Secondary | ICD-10-CM | POA: Diagnosis not present

## 2019-07-07 DIAGNOSIS — I25118 Atherosclerotic heart disease of native coronary artery with other forms of angina pectoris: Secondary | ICD-10-CM | POA: Diagnosis not present

## 2019-07-07 DIAGNOSIS — K2100 Gastro-esophageal reflux disease with esophagitis, without bleeding: Secondary | ICD-10-CM | POA: Insufficient documentation

## 2019-07-07 DIAGNOSIS — N183 Chronic kidney disease, stage 3 unspecified: Secondary | ICD-10-CM | POA: Diagnosis not present

## 2019-07-07 DIAGNOSIS — M791 Myalgia, unspecified site: Secondary | ICD-10-CM

## 2019-07-07 DIAGNOSIS — I10 Essential (primary) hypertension: Secondary | ICD-10-CM

## 2019-07-07 DIAGNOSIS — E785 Hyperlipidemia, unspecified: Secondary | ICD-10-CM

## 2019-07-07 LAB — POCT URINALYSIS DIPSTICK
Bilirubin, UA: NEGATIVE
Blood, UA: NEGATIVE
Glucose, UA: NEGATIVE
Ketones, UA: NEGATIVE
Leukocytes, UA: NEGATIVE
Nitrite, UA: NEGATIVE
Protein, UA: NEGATIVE
Spec Grav, UA: 1.02 (ref 1.010–1.025)
Urobilinogen, UA: 0.2 E.U./dL
pH, UA: 6 (ref 5.0–8.0)

## 2019-07-07 MED ORDER — FAMOTIDINE 20 MG PO TABS: 20.0000 mg | ORAL_TABLET | Freq: Two times a day (BID) | ORAL | 0 refills | Status: DC

## 2019-07-07 NOTE — Progress Notes (Signed)
Date:  07/07/2019   Name:  Kristen Blanchard   DOB:  02-15-1941   MRN:  [id number]   Chief Complaint: Annual Exam (Breast Exam.) and Gastroesophageal Reflux (At night after eating her food comes back up in her throat. Some of it is acid, and some of it is whole food. She eats dinner around 5pm and lays down 3-4 hours later. ) RAFAELLA KOLE is a 78 y.o. female who presents today for her Complete Annual Exam. She feels fairly well. She reports exercising none. She reports she is sleeping fairly well.   Mammograms - discontinued DEXA - 2018 Colonoscopy 2015 Immunizations - none - pt declines all Hypertension This is a chronic problem. The problem is controlled. Pertinent negatives include no chest pain, headaches, palpitations or shortness of breath. Past treatments include beta blockers, diuretics and ACE inhibitors. The current treatment provides significant improvement.  Hyperlipidemia This is a chronic problem. The problem is controlled. Associated symptoms include myalgias (feet and legs hurt constantly - worse sometimes at night). Pertinent negatives include no chest pain or shortness of breath. Current antihyperlipidemic treatment includes statins. The current treatment provides significant improvement of lipids.  Gastroesophageal Reflux She complains of water brash. She reports no abdominal pain, no chest pain, no choking, no dysphagia or no wheezing. This is a new problem. Episode frequency: about weekly. Exacerbated by: spicy or fatty foods. Associated symptoms include fatigue.  CAD - stable CHF - followed closely with Cardiology. Last EF 25% by Echo last year.  She is moderately limited by fatigue with activity.  CKD stage 3 - currently monitoring at regular intervals.  Pt is avoiding nsaids, taking only tylenol as needed for arthritis pain.  Lab Results  Component Value Date   CREATININE 1.29 (H) 07/01/2018   BUN 22 07/01/2018   NA 138 07/01/2018   K 4.5 07/01/2018   CL  97 07/01/2018   CO2 25 07/01/2018   Lab Results  Component Value Date   CHOL 183 07/01/2018   HDL 94 07/01/2018   LDLCALC 70 07/01/2018   TRIG 96 07/01/2018   CHOLHDL 1.9 07/01/2018   Lab Results  Component Value Date   TSH 1.040 07/01/2018   No results found for: HGBA1C   Review of Systems  Constitutional: Positive for fatigue. Negative for appetite change, chills, diaphoresis and unexpected weight change.  HENT: Negative for hearing loss and trouble swallowing.   Eyes: Negative for visual disturbance.  Respiratory: Negative for choking, shortness of breath and wheezing.   Cardiovascular: Negative for chest pain, palpitations and leg swelling.  Gastrointestinal: Negative for abdominal pain, blood in stool, constipation, diarrhea and dysphagia.       Gerd episodes weekly  Genitourinary: Negative for difficulty urinating, dysuria and hematuria.  Musculoskeletal: Positive for arthralgias and myalgias (feet and legs hurt constantly - worse sometimes at night). Negative for gait problem.  Skin: Negative for color change and rash.  Neurological: Negative for dizziness, light-headedness and headaches.  Hematological: Negative for adenopathy.  Psychiatric/Behavioral: Negative for dysphoric mood and sleep disturbance. The patient is not nervous/anxious.     Patient Active Problem List   Diagnosis Date Noted  . Bone pain 09/01/2017  . Left medial knee pain 06/25/2017  . Left-sided chest wall pain 06/25/2017  . Hypomagnesemia 05/27/2017  . 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 Chronic systolic HF (heart failure) (HCC) 07/09/2016  . Chronic renal insufficiency, stage 3 (moderate) 06/13/2016  . Lymphedema of  left upper extremity 06/13/2016  . Anemia 03/20/2016  . Mitral regurgitation 12/03/2015  . Awareness of heartbeats 06/08/2015  . 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 Dyslipidemia  12/13/2014  . Essential (primary) hypertension 12/13/2014  . H/O adenomatous polyp of colon 12/13/2014  . H/O malignant neoplasm of breast 12/13/2014  . Osteoporosis without current pathological fracture 12/13/2014  . 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 Atorvastatin     Other reaction(s): Other (See Comments) alopecia  . Deltasone [Prednisone] Other (See Comments)    Turned red after joint injection  . Simvastatin     Other reaction(s): Muscle Pain  . Tape     Other reaction(s): Unknown  . Codeine Nausea Only    Other reaction(s): UNKNOWN  . Quinolones Rash    Past Surgical History:  Procedure Laterality Date  . BASAL CELL CARCINOMA EXCISION  07/2011  . 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 LAPAROSCOPIC HYSTERECTOMY  1980   partial  . MASTECTOMY Left 02/2009  . MASTECTOMY MODIFIED RADICAL Right 07/1986  . RETINAL DETACHMENT SURGERY  07/2011    Social History   Tobacco Use  . Smoking status: Former Smoker    Packs/day: 0.50    Years: 10.00    Pack years: 5.00    Types: Cigarettes  . Smokeless tobacco: Never Used  Substance Use Topics  . Alcohol use: No    Alcohol/week: 0.0 standard drinks  . Drug use: No     Medication list has been reviewed and updated.  Current Meds  Medication Sig  . acetaminophen (TYLENOL) 650 MG CR tablet Take 650 mg by mouth every morning.  Marland Kitchen alendronate (FOSAMAX) 70 MG tablet TAKE 1 TABLET BY MOUTH ONE TIME PER WEEK  . aspirin 81 MG chewable tablet Chew by mouth.  . Biotin 5 MG CAPS Take by mouth.  . Calcium-Vitamin D-Vitamin K [id number] MG-UNT-MCG CHEW Chew 1 tablet by mouth daily. 600-125  . Coenzyme Q10 100 MG TABS Take by mouth.  Tery Sanfilippo Calcium (STOOL SOFTENER PO) Take 50 mg by mouth.  . furosemide (LASIX) 40 MG tablet Take 1 tablet by mouth daily. On Mondays and Fridays  . lisinopril (PRINIVIL,ZESTRIL) 20 MG tablet Take 0.5 tablets  by mouth daily.   . magnesium oxide (MAG-OX) 400 MG tablet TAKE 1 TABLET BY MOUTH TWICE A DAY  . metoprolol tartrate (LOPRESSOR) 25 MG tablet Take 1 tablet by mouth 2 (two) times daily.  . nitroGLYCERIN (NITROSTAT) 0.4 MG SL tablet Place 1 tablet under the tongue as needed.  . quiNIDine sulfate 300 MG tablet Take 1 tablet by mouth 2 (two) times daily.  . rosuvastatin (CRESTOR) 20 MG tablet Take 20 mg by mouth daily.  Marland Kitchen spironolactone (ALDACTONE) 25 MG tablet Take 0.5 tablets by mouth daily.    PHQ 2/9 Scores 07/07/2019 06/27/2019 07/01/2018 06/25/2017  PHQ - 2 Score 0 0 0 0  PHQ- 9 Score 0 - 3 -    BP Readings from Last 3 Encounters:  07/07/19 124/72  06/27/19 107/60  07/01/18 120/82    Physical Exam Vitals and nursing note reviewed.  Constitutional:      General: She is not in acute distress.    Appearance: Normal appearance. She is well-developed.  HENT:     Head: Normocephalic and atraumatic.     Right Ear: Tympanic membrane and ear canal normal.     Left Ear: Tympanic membrane  and ear canal normal.  Eyes:     General: No scleral icterus.       Right eye: No discharge.        Left eye: No discharge.     Conjunctiva/sclera: Conjunctivae normal.  Neck:     Thyroid: No thyromegaly.     Vascular: No carotid bruit.  Cardiovascular:     Rate and Rhythm: Normal rate and regular rhythm.  No extrasystoles are present.    Pulses:          Radial pulses are 2+ on the right side and 2+ on the left side.       Dorsalis pedis pulses are 1+ on the right side and 1+ on the left side.       Posterior tibial pulses are 1+ on the right side and 1+ on the left side.     Heart sounds: Murmur present. Systolic murmur present with a grade of 2/6. No friction rub. No gallop.   Pulmonary:     Effort: Pulmonary effort is normal. No respiratory distress.     Breath sounds: No wheezing.  Chest:     Comments: Right breast reconstruction - skin intact, no mass or tenderness Left mastectomy site  healed - no skin change or mass Left upper chest - AICD in place Right upper chest - port in place Abdominal:     General: Bowel sounds are normal.     Palpations: Abdomen is soft.     Tenderness: There is no abdominal tenderness.  Musculoskeletal:     Right hand: Bony tenderness present.     Left hand: Bony tenderness (with changes of OA both hands) present.     Cervical back: Normal range of motion. No erythema.     Right lower leg: No tenderness or bony tenderness. No edema.     Left lower leg: No tenderness or bony tenderness. No edema.     Right ankle:     Right Achilles Tendon: Normal.     Left ankle:     Left Achilles Tendon: Normal.  Lymphadenopathy:     Cervical: No cervical adenopathy.  Skin:    General: Skin is warm and dry.     Capillary Refill: Capillary refill takes less than 2 seconds.     Findings: No rash.  Neurological:     General: No focal deficit present.     Mental Status: She is alert and oriented to person, place, and time.     Cranial Nerves: No cranial nerve deficit.     Sensory: No sensory deficit.     Deep Tendon Reflexes: Reflexes are normal and symmetric.  Psychiatric:        Attention and Perception: Attention normal.        Mood and Affect: Mood normal.        Speech: Speech normal.        Behavior: Behavior normal.        Thought Content: Thought content normal.     Wt Readings from Last 3 Encounters:  07/07/19 152 lb (68.9 kg)  06/27/19 150 lb (68 kg)  07/01/18 148 lb (67.1 kg)    BP 124/72 (BP Location: Right Arm, Patient Position: Sitting, Cuff Size: Normal)   Pulse 61   Ht 5\' 3"  (1.6 m)   Wt 152 lb (68.9 kg)   SpO2 98%   BMI 26.93 kg/m   Assessment and Plan: 1. Annual physical exam Stable exam; weight acceptable; pt unable to exercise vigorously She  will continue healthy diet - POCT urinalysis dipstick  2. Essential (primary) hypertension  Blood pressure controlled on current regimen - lasix, aldactone, metoprolol and  lisinopril - Comprehensive metabolic panel - TSH  3. Coronary artery disease of native artery of native heart with stable angina pectoris ([place]) Stable CAD with CHF; recent ECHO EF 25% AICD in place and on aspirin daily Followed regularly by [facility]  4. Dyslipidemia Lipids are well controlled on high intensity statin therapy with Crestor Tolerating medications well without clear side effects - Lipid panel  5. Chronic renal insufficiency, stage 3 (moderate) Continue only tylenol for pain - no NSAID Monitor regularly for change and if medication changes occur - Comprehensive metabolic panel  6. Myalgia In legs and feet - will check vitamin levels and supplement if low - B12 - Vitamin D (25 hydroxy)  7. 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  Dispense: 90 tablet; Refill: 0   Partially dictated using Editor, commissioning. Any errors are unintentional.  Halina Maidens, MD [facility]  07/07/2019

## 2019-07-08 LAB — COMPREHENSIVE METABOLIC PANEL
ALT: 12 IU/L (ref 0–32)
AST: 17 IU/L (ref 0–40)
Albumin/Globulin Ratio: 1.9 (ref 1.2–2.2)
Albumin: 4.5 g/dL (ref 3.7–4.7)
Alkaline Phosphatase: 47 IU/L (ref 39–117)
BUN/Creatinine Ratio: 20 (ref 12–28)
BUN: 26 mg/dL (ref 8–27)
Bilirubin Total: 0.5 mg/dL (ref 0.0–1.2)
CO2: 22 mmol/L (ref 20–29)
Calcium: 10.3 mg/dL (ref 8.7–10.3)
Chloride: 99 mmol/L (ref 96–106)
Creatinine, Ser: 1.29 mg/dL — ABNORMAL HIGH (ref 0.57–1.00)
GFR calc Af Amer: 46 mL/min/{1.73_m2} — ABNORMAL LOW (ref >59)
GFR calc non Af Amer: 40 mL/min/{1.73_m2} — ABNORMAL LOW (ref >59)
Globulin, Total: 2.4 g/dL (ref 1.5–4.5)
Glucose: 97 mg/dL (ref 65–99)
Potassium: 4.6 mmol/L (ref 3.5–5.2)
Sodium: 137 mmol/L (ref 134–144)
Total Protein: 6.9 g/dL (ref 6.0–8.5)

## 2019-07-08 LAB — LIPID PANEL
Chol/HDL Ratio: 2 ratio (ref 0.0–4.4)
Cholesterol, Total: 176 mg/dL (ref 100–199)
HDL: 88 mg/dL (ref >39)
LDL Chol Calc (NIH): 69 mg/dL (ref 0–99)
Triglycerides: 112 mg/dL (ref 0–149)
VLDL Cholesterol Cal: 19 mg/dL (ref 5–40)

## 2019-07-08 LAB — TSH: TSH: 2 u[IU]/mL (ref 0.450–4.500)

## 2019-07-08 LAB — VITAMIN D 25 HYDROXY (VIT D DEFICIENCY, FRACTURES): Vit D, 25-Hydroxy: 40.6 ng/mL (ref 30.0–100.0)

## 2019-07-08 LAB — VITAMIN B12: Vitamin B-12: 378 pg/mL (ref 232–1245)

## 2019-08-13 ENCOUNTER — Other Ambulatory Visit: Payer: Self-pay | Admitting: Internal Medicine

## 2019-08-13 DIAGNOSIS — K2100 Gastro-esophageal reflux disease with esophagitis, without bleeding: Secondary | ICD-10-CM

## 2019-09-20 DIAGNOSIS — L814 Other melanin hyperpigmentation: Secondary | ICD-10-CM | POA: Diagnosis not present

## 2019-09-20 DIAGNOSIS — L578 Other skin changes due to chronic exposure to nonionizing radiation: Secondary | ICD-10-CM | POA: Diagnosis not present

## 2019-09-20 DIAGNOSIS — D229 Melanocytic nevi, unspecified: Secondary | ICD-10-CM | POA: Diagnosis not present

## 2019-09-20 DIAGNOSIS — L820 Inflamed seborrheic keratosis: Secondary | ICD-10-CM | POA: Diagnosis not present

## 2019-09-20 DIAGNOSIS — Z85828 Personal history of other malignant neoplasm of skin: Secondary | ICD-10-CM | POA: Diagnosis not present

## 2019-09-26 DIAGNOSIS — Z170 Estrogen receptor positive status [ER+]: Secondary | ICD-10-CM | POA: Diagnosis not present

## 2019-09-26 DIAGNOSIS — Z9013 Acquired absence of bilateral breasts and nipples: Secondary | ICD-10-CM | POA: Diagnosis not present

## 2019-09-26 DIAGNOSIS — C50412 Malignant neoplasm of upper-outer quadrant of left female breast: Secondary | ICD-10-CM | POA: Diagnosis not present

## 2019-09-27 DIAGNOSIS — I420 Dilated cardiomyopathy: Secondary | ICD-10-CM | POA: Diagnosis not present

## 2019-09-27 DIAGNOSIS — Z4502 Encounter for adjustment and management of automatic implantable cardiac defibrillator: Secondary | ICD-10-CM | POA: Diagnosis not present

## 2019-09-27 DIAGNOSIS — I255 Ischemic cardiomyopathy: Secondary | ICD-10-CM | POA: Diagnosis not present

## 2019-09-27 DIAGNOSIS — Z95810 Presence of automatic (implantable) cardiac defibrillator: Secondary | ICD-10-CM | POA: Diagnosis not present

## 2019-10-07 DIAGNOSIS — I420 Dilated cardiomyopathy: Secondary | ICD-10-CM | POA: Diagnosis not present

## 2019-10-07 DIAGNOSIS — Z95810 Presence of automatic (implantable) cardiac defibrillator: Secondary | ICD-10-CM | POA: Diagnosis not present

## 2019-10-07 DIAGNOSIS — I255 Ischemic cardiomyopathy: Secondary | ICD-10-CM | POA: Diagnosis not present

## 2019-10-07 DIAGNOSIS — I5022 Chronic systolic (congestive) heart failure: Secondary | ICD-10-CM | POA: Diagnosis not present

## 2019-10-07 DIAGNOSIS — I2510 Atherosclerotic heart disease of native coronary artery without angina pectoris: Secondary | ICD-10-CM | POA: Diagnosis not present

## 2019-10-07 DIAGNOSIS — R002 Palpitations: Secondary | ICD-10-CM | POA: Diagnosis not present

## 2019-11-08 DIAGNOSIS — I2510 Atherosclerotic heart disease of native coronary artery without angina pectoris: Secondary | ICD-10-CM | POA: Diagnosis not present

## 2019-11-08 DIAGNOSIS — S83412A Sprain of medial collateral ligament of left knee, initial encounter: Secondary | ICD-10-CM | POA: Diagnosis not present

## 2019-11-08 DIAGNOSIS — I420 Dilated cardiomyopathy: Secondary | ICD-10-CM | POA: Diagnosis not present

## 2019-11-08 DIAGNOSIS — Z95810 Presence of automatic (implantable) cardiac defibrillator: Secondary | ICD-10-CM | POA: Diagnosis not present

## 2019-11-08 DIAGNOSIS — R002 Palpitations: Secondary | ICD-10-CM | POA: Diagnosis not present

## 2019-11-08 DIAGNOSIS — I5022 Chronic systolic (congestive) heart failure: Secondary | ICD-10-CM | POA: Diagnosis not present

## 2019-11-08 DIAGNOSIS — I255 Ischemic cardiomyopathy: Secondary | ICD-10-CM | POA: Diagnosis not present

## 2019-12-24 ENCOUNTER — Other Ambulatory Visit: Payer: Self-pay | Admitting: Internal Medicine

## 2019-12-24 NOTE — Telephone Encounter (Signed)
Requested medication (s) are due for refill today: yes  Requested medication (s) are on the active medication list: yes  Last refill:  09/05/18  Future visit scheduled: yes  Notes to clinic:  overdue labs (Magnesium)   Requested Prescriptions  Pending Prescriptions Disp Refills   magnesium oxide (MAG-OX) 400 MG tablet [Pharmacy Med Name: MAGNESIUM OXIDE 400 MG TABLET] 120 tablet 6    Sig: TAKE 1 TABLET BY MOUTH TWICE A DAY      Endocrinology:  Minerals - Magnesium Supplementation Failed - 12/24/2019 11:23 AM      Failed - Mg Level in normal range and within 360 days    Magnesium  Date Value Ref Range Status  09/01/2017 1.8 1.6 - 2.3 mg/dL Final          Passed - Valid encounter within last 12 months    Recent Outpatient Visits           5 months ago Annual physical exam   [facility] Reubin Milan, MD   1 year ago Annual physical exam   [facility] Reubin Milan, MD   2 years ago Essential (primary) hypertension   [facility] Reubin Milan, MD   2 years ago Hypomagnesemia   [facility] Reubin Milan, MD   2 years ago Annual physical exam   [facility] Reubin Milan, MD       Future Appointments             In 6 months Judithann Graves Nyoka Cowden, MD [facility], [facility]

## 2019-12-27 DIAGNOSIS — Z95810 Presence of automatic (implantable) cardiac defibrillator: Secondary | ICD-10-CM | POA: Diagnosis not present

## 2019-12-27 DIAGNOSIS — Z4502 Encounter for adjustment and management of automatic implantable cardiac defibrillator: Secondary | ICD-10-CM | POA: Diagnosis not present

## 2019-12-27 DIAGNOSIS — I255 Ischemic cardiomyopathy: Secondary | ICD-10-CM | POA: Diagnosis not present

## 2019-12-27 DIAGNOSIS — I420 Dilated cardiomyopathy: Secondary | ICD-10-CM | POA: Diagnosis not present

## 2020-02-07 ENCOUNTER — Other Ambulatory Visit: Payer: Self-pay | Admitting: Internal Medicine

## 2020-02-07 DIAGNOSIS — K2100 Gastro-esophageal reflux disease with esophagitis, without bleeding: Secondary | ICD-10-CM

## 2020-02-07 NOTE — Telephone Encounter (Signed)
Requested Prescriptions  Pending Prescriptions Disp Refills   famotidine (PEPCID) 20 MG tablet [Pharmacy Med Name: FAMOTIDINE 20 MG TABLET] 180 tablet 1    Sig: TAKE 1 TABLET BY MOUTH TWICE A DAY     Gastroenterology:  H2 Antagonists Passed - 02/07/2020  1:18 AM      Passed - Valid encounter within last 12 months    Recent Outpatient Visits          7 months ago Annual physical exam   [facility] Reubin Milan, MD   1 year ago Annual physical exam   [facility] Reubin Milan, MD   2 years ago Essential (primary) hypertension   [facility] Reubin Milan, MD   2 years ago Hypomagnesemia   [facility] Reubin Milan, MD   2 years ago Annual physical exam   [facility] Reubin Milan, MD      Future Appointments            In 5 months Judithann Graves Nyoka Cowden, MD [facility], [facility]

## 2020-04-04 DIAGNOSIS — Z95810 Presence of automatic (implantable) cardiac defibrillator: Secondary | ICD-10-CM | POA: Diagnosis not present

## 2020-04-04 DIAGNOSIS — I2510 Atherosclerotic heart disease of native coronary artery without angina pectoris: Secondary | ICD-10-CM | POA: Diagnosis not present

## 2020-04-04 DIAGNOSIS — I420 Dilated cardiomyopathy: Secondary | ICD-10-CM | POA: Diagnosis not present

## 2020-04-04 DIAGNOSIS — I255 Ischemic cardiomyopathy: Secondary | ICD-10-CM | POA: Diagnosis not present

## 2020-04-04 DIAGNOSIS — Z4502 Encounter for adjustment and management of automatic implantable cardiac defibrillator: Secondary | ICD-10-CM | POA: Diagnosis not present

## 2020-04-04 DIAGNOSIS — I5022 Chronic systolic (congestive) heart failure: Secondary | ICD-10-CM | POA: Diagnosis not present

## 2020-04-08 ENCOUNTER — Other Ambulatory Visit: Payer: Self-pay | Admitting: Internal Medicine

## 2020-04-08 NOTE — Telephone Encounter (Signed)
Requested Prescriptions  Pending Prescriptions Disp Refills   alendronate (FOSAMAX) 70 MG tablet [Pharmacy Med Name: ALENDRONATE SODIUM 70 MG TAB] 12 tablet 0    Sig: TAKE 1 TABLET BY MOUTH ONE TIME PER WEEK     Endocrinology:  Bisphosphonates Passed - 04/08/2020 10:40 AM      Passed - Ca in normal range and within 360 days    Calcium  Date Value Ref Range Status  07/07/2019 10.3 8.7 - 10.3 mg/dL Final         Passed - Vitamin D in normal range and within 360 days    Vit D, 25-Hydroxy  Date Value Ref Range Status  07/07/2019 40.6 30.0 - 100.0 ng/mL Final    Comment:    Vitamin D deficiency has been defined by the Institute of Medicine and an Endocrine Society practice guideline as a level of serum 25-OH vitamin D less than 20 ng/mL (1,2). The Endocrine Society went on to further define vitamin D insufficiency as a level between 21 and 29 ng/mL (2). 1. IOM (Institute of Medicine). 2010. Dietary reference    intakes for calcium and D. [place]: The    [employer]. 2. Holick MF, Binkley [place], Bischoff-Ferrari HA, et al.    Evaluation, treatment, and prevention of vitamin D    deficiency: an Endocrine Society clinical practice    guideline. JCEM. 2011 Jul; 96(7):1911-30.          Passed - Valid encounter within last 12 months    Recent Outpatient Visits          9 months ago Annual physical exam   [facility] Reubin Milan, MD   1 year ago Annual physical exam   [facility] Reubin Milan, MD   2 years ago Essential (primary) hypertension   [facility] Reubin Milan, MD   2 years ago Hypomagnesemia   [facility] Reubin Milan, MD   2 years ago Annual physical exam   [facility] Reubin Milan, MD      Future Appointments            In 3 months Judithann Graves Nyoka Cowden, MD [facility], [facility]

## 2020-04-12 DIAGNOSIS — Z79899 Other long term (current) drug therapy: Secondary | ICD-10-CM | POA: Diagnosis not present

## 2020-04-12 DIAGNOSIS — Z170 Estrogen receptor positive status [ER+]: Secondary | ICD-10-CM | POA: Diagnosis not present

## 2020-04-12 DIAGNOSIS — I972 Postmastectomy lymphedema syndrome: Secondary | ICD-10-CM | POA: Diagnosis not present

## 2020-04-12 DIAGNOSIS — N183 Chronic kidney disease, stage 3 unspecified: Secondary | ICD-10-CM | POA: Diagnosis not present

## 2020-04-12 DIAGNOSIS — M898X9 Other specified disorders of bone, unspecified site: Secondary | ICD-10-CM | POA: Diagnosis not present

## 2020-04-12 DIAGNOSIS — Z8739 Personal history of other diseases of the musculoskeletal system and connective tissue: Secondary | ICD-10-CM | POA: Diagnosis not present

## 2020-04-12 DIAGNOSIS — C50412 Malignant neoplasm of upper-outer quadrant of left female breast: Secondary | ICD-10-CM | POA: Diagnosis not present

## 2020-04-12 DIAGNOSIS — Z923 Personal history of irradiation: Secondary | ICD-10-CM | POA: Diagnosis not present

## 2020-04-22 ENCOUNTER — Other Ambulatory Visit: Payer: Self-pay | Admitting: Internal Medicine

## 2020-04-22 NOTE — Telephone Encounter (Signed)
Requested Prescriptions  Pending Prescriptions Disp Refills  . magnesium oxide (MAG-OX) 400 MG tablet [Pharmacy Med Name: MAGNESIUM OXIDE 400 MG TABLET] 180 tablet 0    Sig: TAKE 1 TABLET BY MOUTH TWICE A DAY     Endocrinology:  Minerals - Magnesium Supplementation Failed - 04/22/2020  2:30 PM      Failed - Mg Level in normal range and within 360 days    Magnesium  Date Value Ref Range Status  09/01/2017 1.8 1.6 - 2.3 mg/dL Final         Passed - Valid encounter within last 12 months    Recent Outpatient Visits          9 months ago Annual physical exam   [facility] Reubin Milan, MD   1 year ago Annual physical exam   [facility] Reubin Milan, MD   2 years ago Essential (primary) hypertension   [facility] Reubin Milan, MD   2 years ago Hypomagnesemia   [facility] Reubin Milan, MD   2 years ago Annual physical exam   [facility] Reubin Milan, MD      Future Appointments            In 2 months Judithann Graves Nyoka Cowden, MD [facility], [facility]

## 2020-06-24 ENCOUNTER — Other Ambulatory Visit: Payer: Self-pay | Admitting: Internal Medicine

## 2020-06-27 ENCOUNTER — Other Ambulatory Visit: Payer: Self-pay

## 2020-06-27 ENCOUNTER — Ambulatory Visit (INDEPENDENT_AMBULATORY_CARE_PROVIDER_SITE_OTHER): Payer: Medicare PPO

## 2020-06-27 VITALS — BP 118/66 | HR 60 | Temp 97.6°F | Resp 16 | Ht 63.0 in | Wt 153.4 lb

## 2020-06-27 DIAGNOSIS — Z0000 Encounter for general adult medical examination without abnormal findings: Secondary | ICD-10-CM | POA: Diagnosis not present

## 2020-06-27 NOTE — Patient Instructions (Signed)
Kristen Blanchard , Thank you for taking time to come for your Medicare Wellness Visit. I appreciate your ongoing commitment to your health goals. Please review the following plan we discussed and let me know if I can assist you in the future.   Screening recommendations/referrals: Colonoscopy: done 09/20/13 Bone Density: done 04/08/17 Recommended yearly ophthalmology/optometry visit for glaucoma screening and checkup Recommended yearly dental visit for hygiene and checkup  Vaccinations: Influenza vaccine: declined Pneumococcal vaccine: declined Tdap vaccine: due Shingles vaccine: declined Covid-19: done 09/23/19, 10/13/19 & 05/24/20  Advanced directives: Please bring a copy of your health care power of attorney and living will to the office at your convenience.  Conditions/risks identified: Recommend increasing physical activity as tolerated  Next appointment: Follow up in one year for your annual wellness visit    Preventive Care 65 Years and Older, Female Preventive care refers to lifestyle choices and visits with your health care provider that can promote health and wellness. What does preventive care include?  A yearly physical exam. This is also called an annual well check.  Dental exams once or twice a year.  Routine eye exams. Ask your health care provider how often you should have your eyes checked.  Personal lifestyle choices, including:  Daily care of your teeth and gums.  Regular physical activity.  Eating a healthy diet.  Avoiding tobacco and drug use.  Limiting alcohol use.  Practicing safe sex.  Taking low-dose aspirin every day.  Taking vitamin and mineral supplements as recommended by your health care provider. What happens during an annual well check? The services and screenings done by your health care provider during your annual well check will depend on your age, overall health, lifestyle risk factors, and family history of disease. Counseling  Your  health care provider may ask you questions about your:  Alcohol use.  Tobacco use.  Drug use.  Emotional well-being.  Home and relationship well-being.  Sexual activity.  Eating habits.  History of falls.  Memory and ability to understand (cognition).  Work and work Astronomer.  Reproductive health. Screening  You may have the following tests or measurements:  Height, weight, and BMI.  Blood pressure.  Lipid and cholesterol levels. These may be checked every 5 years, or more frequently if you are over 31 years old.  Skin check.  Lung cancer screening. You may have this screening every year starting at age 46 if you have a 30-pack-year history of smoking and currently smoke or have quit within the past 15 years.  Fecal occult blood test (FOBT) of the stool. You may have this test every year starting at age 46.  Flexible sigmoidoscopy or colonoscopy. You may have a sigmoidoscopy every 5 years or a colonoscopy every 10 years starting at age 75.  Hepatitis C blood test.  Hepatitis B blood test.  Sexually transmitted disease (STD) testing.  Diabetes screening. This is done by checking your blood sugar (glucose) after you have not eaten for a while (fasting). You may have this done every 1-3 years.  Bone density scan. This is done to screen for osteoporosis. You may have this done starting at age 67.  Mammogram. This may be done every 1-2 years. Talk to your health care provider about how often you should have regular mammograms. Talk with your health care provider about your test results, treatment options, and if necessary, the need for more tests. Vaccines  Your health care provider may recommend certain vaccines, such as:  Influenza vaccine. This is  recommended every year.  Tetanus, diphtheria, and acellular pertussis (Tdap, Td) vaccine. You may need a Td booster every 10 years.  Zoster vaccine. You may need this after age 55.  Pneumococcal 13-valent  conjugate (PCV13) vaccine. One dose is recommended after age 58.  Pneumococcal polysaccharide (PPSV23) vaccine. One dose is recommended after age 59. Talk to your health care provider about which screenings and vaccines you need and how often you need them. This information is not intended to replace advice given to you by your health care provider. Make sure you discuss any questions you have with your health care provider. Document Released: 08/10/2015 Document Revised: 04/02/2016 Document Reviewed: 05/15/2015 Elsevier Interactive Patient Education  2017 [place] Prevention in the Home Falls can cause injuries. They can happen to people of all ages. There are many things you can do to make your home safe and to help prevent falls. What can I do on the outside of my home?  Regularly fix the edges of walkways and driveways and fix any cracks.  Remove anything that might make you trip as you walk through a door, such as a raised step or threshold.  Trim any bushes or trees on the path to your home.  Use bright outdoor lighting.  Clear any walking paths of anything that might make someone trip, such as rocks or tools.  Regularly check to see if handrails are loose or broken. Make sure that both sides of any steps have handrails.  Any raised decks and porches should have guardrails on the edges.  Have any leaves, snow, or ice cleared regularly.  Use sand or salt on walking paths during winter.  Clean up any spills in your garage right away. This includes oil or grease spills. What can I do in the bathroom?  Use night lights.  Install grab bars by the toilet and in the tub and shower. Do not use towel bars as grab bars.  Use non-skid mats or decals in the tub or shower.  If you need to sit down in the shower, use a plastic, non-slip stool.  Keep the floor dry. Clean up any water that spills on the floor as soon as it happens.  Remove soap buildup in the tub or  shower regularly.  Attach bath mats securely with double-sided non-slip rug tape.  Do not have throw rugs and other things on the floor that can make you trip. What can I do in the bedroom?  Use night lights.  Make sure that you have a light by your bed that is easy to reach.  Do not use any sheets or blankets that are too big for your bed. They should not hang down onto the floor.  Have a firm chair that has side arms. You can use this for support while you get dressed.  Do not have throw rugs and other things on the floor that can make you trip. What can I do in the kitchen?  Clean up any spills right away.  Avoid walking on wet floors.  Keep items that you use a lot in easy-to-reach places.  If you need to reach something above you, use a strong step stool that has a grab bar.  Keep electrical cords out of the way.  Do not use floor polish or wax that makes floors slippery. If you must use wax, use non-skid floor wax.  Do not have throw rugs and other things on the floor that can make you trip.  What can I do with my stairs?  Do not leave any items on the stairs.  Make sure that there are handrails on both sides of the stairs and use them. Fix handrails that are broken or loose. Make sure that handrails are as long as the stairways.  Check any carpeting to make sure that it is firmly attached to the stairs. Fix any carpet that is loose or worn.  Avoid having throw rugs at the top or bottom of the stairs. If you do have throw rugs, attach them to the floor with carpet tape.  Make sure that you have a light switch at the top of the stairs and the bottom of the stairs. If you do not have them, ask someone to add them for you. What else can I do to help prevent falls?  Wear shoes that:  Do not have high heels.  Have rubber bottoms.  Are comfortable and fit you well.  Are closed at the toe. Do not wear sandals.  If you use a stepladder:  Make sure that it is fully  opened. Do not climb a closed stepladder.  Make sure that both sides of the stepladder are locked into place.  Ask someone to hold it for you, if possible.  Clearly mark and make sure that you can see:  Any grab bars or handrails.  First and last steps.  Where the edge of each step is.  Use tools that help you move around (mobility aids) if they are needed. These include:  Canes.  Walkers.  Scooters.  Crutches.  Turn on the lights when you go into a dark area. Replace any light bulbs as soon as they burn out.  Set up your furniture so you have a clear path. Avoid moving your furniture around.  If any of your floors are uneven, fix them.  If there are any pets around you, be aware of where they are.  Review your medicines with your doctor. Some medicines can make you feel dizzy. This can increase your chance of falling. Ask your doctor what other things that you can do to help prevent falls. This information is not intended to replace advice given to you by your health care provider. Make sure you discuss any questions you have with your health care provider. Document Released: 05/10/2009 Document Revised: 12/20/2015 Document Reviewed: 08/18/2014 Elsevier Interactive Patient Education  2017 [employer].

## 2020-06-27 NOTE — Progress Notes (Signed)
Subjective:   Kristen Blanchard is a 79 y.o. female who presents for Medicare Annual (Subsequent) preventive examination.  Review of Systems     Cardiac Risk Factors include: advanced age (>62men, >34 women);dyslipidemia;hypertension;sedentary lifestyle     Objective:    Today's Vitals   06/27/20 1048  BP: 118/66  Pulse: 60  Resp: 16  Temp: 97.6 F (36.4 C)  TempSrc: Oral  SpO2: 98%  Weight: 153 lb 6.4 oz (69.6 kg)  Height: 5\' 3"  (1.6 m)   Body mass index is 27.17 kg/m.  Advanced Directives 06/27/2020 06/27/2019 06/25/2017 06/13/2016  Does Patient Have a Medical Advance Directive? Yes No Yes No  Type of 06/15/2016 of [place];Living will - - -  Does patient want to make changes to medical advance directive? - - Yes (MAU/Ambulatory/Procedural Areas - Information given) -  Copy of Healthcare Power of Attorney in Chart? No - copy requested - - -  Would patient like information on creating a medical advance directive? - No - Patient declined - -    Current Medications (verified) Outpatient Encounter Medications as of 06/27/2020  Medication Sig  . acetaminophen (TYLENOL) 650 MG CR tablet Take 650 mg by mouth every morning.  14/07/2019 alendronate (FOSAMAX) 70 MG tablet TAKE 1 TABLET BY MOUTH ONE TIME PER WEEK  . aspirin 81 MG chewable tablet Chew by mouth.  . Biotin 5 MG CAPS Take by mouth.  . Calcium-Vitamin D-Vitamin K [phone] MG-UNT-MCG CHEW Chew 1 tablet by mouth daily. 600-125  . Coenzyme Q10 100 MG TABS Take by mouth.  Marland Kitchen Calcium (STOOL SOFTENER PO) Take 50 mg by mouth.  . famotidine (PEPCID) 20 MG tablet TAKE 1 TABLET BY MOUTH TWICE A DAY  . furosemide (LASIX) 40 MG tablet Take 1 tablet by mouth daily. On Mondays and Fridays  . lisinopril (ZESTRIL) 10 MG tablet Take 1 tablet by mouth daily.  . magnesium oxide (MAG-OX) 400 MG tablet TAKE 1 TABLET BY MOUTH TWICE A DAY  . metoprolol tartrate (LOPRESSOR) 25 MG tablet Take 1 tablet by mouth 2  (two) times daily.  . nitroGLYCERIN (NITROSTAT) 0.4 MG SL tablet Place 1 tablet under the tongue as needed.  . quiNIDine sulfate 300 MG tablet Take 1 tablet by mouth 2 (two) times daily.  . rosuvastatin (CRESTOR) 20 MG tablet Take 20 mg by mouth daily.  12-15-1989 spironolactone (ALDACTONE) 25 MG tablet Take 0.5 tablets by mouth daily.  . [DISCONTINUED] cholecalciferol (VITAMIN D3) 25 MCG (1000 UNIT) tablet Take 1,000 Units by mouth daily.  . cyanocobalamin 1000 MCG tablet Take by mouth.  . [DISCONTINUED] lisinopril (PRINIVIL,ZESTRIL) 20 MG tablet Take 0.5 tablets by mouth daily.    No facility-administered encounter medications on file as of 06/27/2020.    Allergies (verified) Atorvastatin, Deltasone [prednisone], Simvastatin, Tape, Codeine, and Quinolones   History: Past Medical History:  Diagnosis Date  . History of breast cancer   . Hyperlipidemia   . Hypertension    Past Surgical History:  Procedure Laterality Date  . BASAL CELL CARCINOMA EXCISION  07/2011  . 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 LAPAROSCOPIC HYSTERECTOMY  1980   partial  . MASTECTOMY Left 02/2009  . MASTECTOMY MODIFIED RADICAL Right 07/1986  . RETINAL DETACHMENT SURGERY  07/2011   Family History  Problem Relation Age of Onset  . Cancer Mother        Breast  . Cancer Father        lung  . Rheum arthritis  Brother   . Healthy Brother    Social History   Socioeconomic History  . Marital status: Married    Spouse name: Not on file  . Number of children: 0  . Years of education: Not on file  . Highest education level: Not on file  Occupational History  . Occupation: Retired  Tobacco Use  . Smoking status: Former Smoker    Packs/day: 0.50    Years: 10.00    Pack years: 5.00    Types: Cigarettes  . Smokeless tobacco: Never Used  Vaping Use  . Vaping Use: Never used  Substance and Sexual Activity  . Alcohol use: No    Alcohol/week: 0.0 standard drinks  . Drug use: No  . Sexual activity: Not  Currently  Other Topics Concern  . Not on file  Social History Narrative  . Not on file   Social Determinants of Health   Financial Resource Strain: Low Risk   . Difficulty of Paying Living Expenses: Not hard at all  Food Insecurity: No Food Insecurity  . Worried About Programme researcher, broadcasting/film/video in the Last Year: Never true  . Ran Out of Food in the Last Year: Never true  Transportation Needs: No Transportation Needs  . Lack of Transportation (Medical): No  . Lack of Transportation (Non-Medical): No  Physical Activity: Inactive  . Days of Exercise per Week: 0 days  . Minutes of Exercise per Session: 0 min  Stress: No Stress Concern Present  . Feeling of Stress : Only a little  Social Connections: Moderately Integrated  . Frequency of Communication with Friends and Family: More than three times a week  . Frequency of Social Gatherings with Friends and Family: Once a week  . Attends Religious Services: More than 4 times per year  . Active Member of Clubs or Organizations: No  . Attends Banker Meetings: Never  . Marital Status: Married    Tobacco Counseling Counseling given: Not Answered   Clinical Intake:  Pre-visit preparation completed: Yes  Pain : No/denies pain     BMI - recorded: 27.17 Nutritional Status: BMI 25 -29 Overweight Nutritional Risks: None Diabetes: No  How often do you need to have someone help you when you read instructions, pamphlets, or other written materials from your doctor or pharmacy?: 1 - Never    Interpreter Needed?: No  Information entered by :: Reather Littler LPN   Activities of Daily Living In your present state of health, do you have any difficulty performing the following activities: 06/27/2020  Hearing? N  Comment declines hearing aids  Vision? N  Difficulty concentrating or making decisions? N  Walking or climbing stairs? Y  Dressing or bathing? N  Doing errands, shopping? N  Preparing Food and eating ? N  Using the  Toilet? N  In the past six months, have you accidently leaked urine? Y  Comment wears liners for protection  Do you have problems with loss of bowel control? N  Managing your Medications? N  Managing your Finances? N  Housekeeping or managing your Housekeeping? N  Some recent data might be hidden    Patient Care Team: Reubin Milan, MD as PCP - General (Internal Medicine) Hardie-Hood, Murvin Natal, MD (Inactive) as Referring Physician (Oncology) Kurtis Bushman, MD as Referring Physician (Dermatology) Gae Gallop, MD as Referring Physician (Cardiology) Rogers Seeds, MD as Referring Physician (Oncology)  Indicate any recent Medical Services you may have received from other than [facility] providers in the  past year (date may be approximate).     Assessment:   This is a routine wellness examination for Kristen Blanchard.  Hearing/Vision screen  Hearing Screening   125Hz  250Hz  500Hz  1000Hz  2000Hz  3000Hz  4000Hz  6000Hz  8000Hz   Right ear:           Left ear:           Comments: Pt denies hearing difficulty  Vision Screening Comments: Annual vision screenings done by Dr. Charlsie MerlesMark Newman  Dietary issues and exercise activities discussed: Current Exercise Habits: The patient does not participate in regular exercise at present, Exercise limited by: orthopedic condition(s)  Goals    . Exercise 3x per week (30 min per time)     Patient wants to find some activities outside the house.    . Feel Better     Would like to feel better overall.       Depression Screen PHQ 2/9 Scores 06/27/2020 07/07/2019 06/27/2019 07/01/2018 06/25/2017 06/08/2017 06/08/2015  PHQ - 2 Score 0 0 0 0 0 0 6  PHQ- 9 Score - 0 - 3 - - 11    Fall Risk Fall Risk  06/27/2020 06/27/2019 07/01/2018 06/25/2017 06/08/2017  Falls in the past year? 0 0 1 No No  Comment - - passed out but didn't hit floor - -  Number falls in past yr: 0 0 0 - -  Injury with Fall? 0 0 0 - -  Risk for fall due to : Impaired balance/gait  Orthopedic patient - - -  Follow up Falls prevention discussed Falls prevention discussed Falls evaluation completed - -    Any stairs in or around the home? Yes  If so, are there any without handrails? No  Home free of loose throw rugs in walkways, pet beds, electrical cords, etc? Yes  Adequate lighting in your home to reduce risk of falls? Yes   ASSISTIVE DEVICES UTILIZED TO PREVENT FALLS:  Life alert? No  Use of a cane, walker or w/c? Yes  Grab bars in the bathroom? Yes  Shower chair or bench in shower? No  Elevated toilet seat or a handicapped toilet? Yes   TIMED UP AND GO:  Was the test performed? Yes .  Length of time to ambulate 10 feet: 6 sec.   Gait steady and fast with assistive device  Cognitive Function: Normal cognitive status assessed by direct observation by this Nurse Health Advisor.       6CIT Screen 06/27/2019 06/25/2017 06/13/2016  What Year? 0 points 0 points 0 points  What month? 0 points 0 points 0 points  What time? 0 points 0 points 0 points  Count back from 20 0 points 0 points 0 points  Months in reverse 0 points 0 points 0 points  Repeat phrase 0 points 0 points 0 points  Total Score 0 0 0    Immunizations Immunization History  Administered Date(s) Administered  . PFIZER SARS-COV-2 Vaccination 09/23/2019, 10/13/2019, 05/24/2020     TDAP status: Due, Education has been provided regarding the importance of this vaccine. Advised may receive this vaccine at local pharmacy or Health Dept. Aware to provide a copy of the vaccination record if obtained from local pharmacy or Health Dept. Verbalized acceptance and understanding.   Flu Vaccine status: Declined, Education has been provided regarding the importance of this vaccine but patient still declined. Advised may receive this vaccine at local pharmacy or Health Dept. Aware to provide a copy of the vaccination record if obtained from local pharmacy or  Health Dept. Verbalized acceptance and  understanding.   Pneumococcal vaccine status: Declined,  Education has been provided regarding the importance of this vaccine but patient still declined. Advised may receive this vaccine at local pharmacy or Health Dept. Aware to provide a copy of the vaccination record if obtained from local pharmacy or Health Dept. Verbalized acceptance and understanding.    Covid-19 vaccine status: Completed vaccines  Qualifies for Shingles Vaccine? Yes   Zostavax completed No   Shingrix Completed?: No.    Education has been provided regarding the importance of this vaccine. Patient has been advised to call insurance company to determine out of pocket expense if they have not yet received this vaccine. Advised may also receive vaccine at local pharmacy or Health Dept. Verbalized acceptance and understanding.  Screening Tests Health Maintenance  Topic Date Due  . Hepatitis C Screening  Never done  . PNA vac Low Risk Adult (1 of 2 - PCV13) 07/06/2020 (Originally 11/24/2007)  . INFLUENZA VACCINE  10/25/2020 (Originally 02/26/2020)  . TETANUS/TDAP  07/28/2021 (Originally 11/23/1959)  . DEXA SCAN  Completed  . COVID-19 Vaccine  Completed    Health Maintenance  Health Maintenance Due  Topic Date Due  . Hepatitis C Screening  Never done    Colorectal cancer screening: Completed 09/20/13. Repeat every 10 years   Mammogram status: No longer required.  due to mastectomy  Bone Density status: Completed 04/08/17. Results reflect: Bone density results: OSTEOPENIA. Repeat every 2 years.  Lung Cancer Screening: (Low Dose CT Chest recommended if Age 63-80 years, 30 pack-year currently smoking OR have quit w/in 15years.) does not qualify.   Additional Screening:  Hepatitis C Screening: does qualify; postponed  Vision Screening: Recommended annual ophthalmology exams for early detection of glaucoma and other disorders of the eye. Is the patient up to date with their annual eye exam?  Yes  Who is the provider or  what is the name of the office in which the patient attends annual eye exams? Dr. Ezzard Standing  Dental Screening: Recommended annual dental exams for proper oral hygiene  Community Resource Referral / Chronic Care Management: CRR required this visit?  No   CCM required this visit?  No      Plan:     I have personally reviewed and noted the following in the patient's chart:   . Medical and social history . Use of alcohol, tobacco or illicit drugs  . Current medications and supplements . Functional ability and status . Nutritional status . Physical activity . Advanced directives . List of other physicians . Hospitalizations, surgeries, and ER visits in previous 12 months . Vitals . Screenings to include cognitive, depression, and falls . Referrals and appointments  In addition, I have reviewed and discussed with patient certain preventive protocols, quality metrics, and best practice recommendations. A written personalized care plan for preventive services as well as general preventive health recommendations were provided to patient.     Reather Littler, LPN   29/03/2425   Nurse Notes: pt states she was told by her oncologist Dr. Dimas Aguas that she should no longer be on fosamax due to effect on kidney. Patient has not d/c'd due to waiting to discuss with Dr. Judithann Graves for possible alternative options.   Pt also c/o burning/tingling in feet at night and concerned about possible neuropathy. Pt states it does not occur every night but has woken her up at times due to discomfort.

## 2020-07-05 DIAGNOSIS — I5022 Chronic systolic (congestive) heart failure: Secondary | ICD-10-CM | POA: Diagnosis not present

## 2020-07-05 DIAGNOSIS — Z4502 Encounter for adjustment and management of automatic implantable cardiac defibrillator: Secondary | ICD-10-CM | POA: Diagnosis not present

## 2020-07-05 DIAGNOSIS — Z95810 Presence of automatic (implantable) cardiac defibrillator: Secondary | ICD-10-CM | POA: Diagnosis not present

## 2020-07-09 DIAGNOSIS — H40013 Open angle with borderline findings, low risk, bilateral: Secondary | ICD-10-CM | POA: Diagnosis not present

## 2020-07-09 DIAGNOSIS — Z452 Encounter for adjustment and management of vascular access device: Secondary | ICD-10-CM | POA: Diagnosis not present

## 2020-07-09 DIAGNOSIS — H35372 Puckering of macula, left eye: Secondary | ICD-10-CM | POA: Diagnosis not present

## 2020-07-09 DIAGNOSIS — Z961 Presence of intraocular lens: Secondary | ICD-10-CM | POA: Diagnosis not present

## 2020-07-11 ENCOUNTER — Encounter: Payer: Self-pay | Admitting: Internal Medicine

## 2020-07-11 ENCOUNTER — Other Ambulatory Visit: Payer: Self-pay

## 2020-07-11 ENCOUNTER — Other Ambulatory Visit: Payer: Self-pay | Admitting: Internal Medicine

## 2020-07-11 ENCOUNTER — Ambulatory Visit (INDEPENDENT_AMBULATORY_CARE_PROVIDER_SITE_OTHER): Payer: Medicare PPO | Admitting: Internal Medicine

## 2020-07-11 VITALS — BP 110/74 | HR 60 | Temp 98.3°F | Ht 63.0 in | Wt 153.0 lb

## 2020-07-11 DIAGNOSIS — Z1159 Encounter for screening for other viral diseases: Secondary | ICD-10-CM

## 2020-07-11 DIAGNOSIS — N183 Chronic kidney disease, stage 3 unspecified: Secondary | ICD-10-CM | POA: Diagnosis not present

## 2020-07-11 DIAGNOSIS — I10 Essential (primary) hypertension: Secondary | ICD-10-CM | POA: Diagnosis not present

## 2020-07-11 DIAGNOSIS — Z0000 Encounter for general adult medical examination without abnormal findings: Secondary | ICD-10-CM

## 2020-07-11 DIAGNOSIS — T8544XA Capsular contracture of breast implant, initial encounter: Secondary | ICD-10-CM | POA: Diagnosis not present

## 2020-07-11 DIAGNOSIS — M79622 Pain in left upper arm: Secondary | ICD-10-CM

## 2020-07-11 DIAGNOSIS — I5022 Chronic systolic (congestive) heart failure: Secondary | ICD-10-CM | POA: Diagnosis not present

## 2020-07-11 DIAGNOSIS — I25118 Atherosclerotic heart disease of native coronary artery with other forms of angina pectoris: Secondary | ICD-10-CM | POA: Diagnosis not present

## 2020-07-11 DIAGNOSIS — K2100 Gastro-esophageal reflux disease with esophagitis, without bleeding: Secondary | ICD-10-CM | POA: Diagnosis not present

## 2020-07-11 DIAGNOSIS — E785 Hyperlipidemia, unspecified: Secondary | ICD-10-CM | POA: Diagnosis not present

## 2020-07-11 NOTE — Progress Notes (Signed)
Date:  07/11/2020   Name:  Kristen Blanchard   DOB:  12/01/1940   MRN:  [id number]   Chief Complaint: Annual Exam (Breast exam no pap)  Kristen Blanchard is a 79 y.o. female who presents today for her Complete Annual Exam. She feels well. She reports exercising none. She reports she is sleeping well. Breast complaints pain under left arm, mid chest pain where implant is.  Mammogram: discontinued DEXA: 03/2017 osteopenia of hip Colonoscopy: aged out - last done 2015  Immunization History  Administered Date(s) Administered  . PFIZER SARS-COV-2 Vaccination 09/23/2019, 10/13/2019, 05/24/2020    Hypertension This is a chronic problem. The problem is controlled. Pertinent negatives include no chest pain, headaches, palpitations or shortness of breath. Past treatments include beta blockers, diuretics and ACE inhibitors. The current treatment provides significant improvement. Hypertensive end-organ damage includes kidney disease and CAD/MI.  Hyperlipidemia This is a chronic problem. The problem is controlled. Pertinent negatives include no chest pain or shortness of breath. Current antihyperlipidemic treatment includes statins. The current treatment provides significant improvement of lipids.  Gastroesophageal Reflux She complains of heartburn and water brash. She reports no abdominal pain, no chest pain, no coughing or no wheezing. This is a recurrent problem. The problem occurs rarely. Pertinent negatives include no fatigue. She has tried a histamine-2 antagonist for the symptoms. The treatment provided moderate (only taking once a day) relief.    Lab Results  Component Value Date   CREATININE 1.29 (H) 07/07/2019   BUN 26 07/07/2019   NA 137 07/07/2019   K 4.6 07/07/2019   CL 99 07/07/2019   CO2 22 07/07/2019   Lab Results  Component Value Date   CHOL 176 07/07/2019   HDL 88 07/07/2019   LDLCALC 69 07/07/2019   TRIG 112 07/07/2019   CHOLHDL 2.0 07/07/2019   Lab Results   Component Value Date   TSH 2.000 07/07/2019   No results found for: HGBA1C Lab Results  Component Value Date   WBC 8.8 06/08/2017   HGB 11.9 06/08/2017   HCT 36.2 06/08/2017   MCV 89 06/08/2017   PLT 200 06/08/2017   Lab Results  Component Value Date   ALT 12 07/07/2019   AST 17 07/07/2019   ALKPHOS 47 07/07/2019   BILITOT 0.5 07/07/2019     Review of Systems  Constitutional: Negative for chills, fatigue and fever.  HENT: Negative for congestion, hearing loss and trouble swallowing.   Eyes: Negative for visual disturbance.  Respiratory: Negative for cough, chest tightness, shortness of breath and wheezing.   Cardiovascular: Negative for chest pain, palpitations and leg swelling.  Gastrointestinal: Positive for heartburn. Negative for abdominal pain, constipation, diarrhea and vomiting.  Endocrine: Negative for polydipsia and polyuria.  Genitourinary: Negative for dysuria, frequency, genital sores, vaginal bleeding and vaginal discharge.       Chest wall pain on left in axilla and pain in medial right breast implant  Musculoskeletal: Positive for arthralgias (knee arthritis). Negative for gait problem and joint swelling.  Skin: Negative for color change and rash.  Neurological: Positive for numbness (tingling in toes - new in the past year). Negative for dizziness, tremors, light-headedness and headaches.  Hematological: Negative for adenopathy. Does not bruise/bleed easily.  Psychiatric/Behavioral: Negative for dysphoric mood and sleep disturbance. The patient is not nervous/anxious.     Patient Active Problem List   Diagnosis Date Noted  . Gastroesophageal reflux disease with esophagitis without hemorrhage 07/07/2019  . Myalgia 07/07/2019  . Bone pain  09/01/2017  . Left medial knee pain 06/25/2017  . Left-sided chest wall pain 06/25/2017  . Hypomagnesemia 05/27/2017  . 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  Chronic systolic HF (heart failure) (HCC) 07/09/2016  . Chronic renal insufficiency, stage 3 (moderate) (HCC) 06/13/2016  . Lymphedema of left upper extremity 06/13/2016  . Anemia 03/20/2016  . Mitral regurgitation 12/03/2015  . Awareness of heartbeats 06/08/2015  . 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 Dyslipidemia 12/13/2014  . Essential (primary) hypertension 12/13/2014  . H/O adenomatous polyp of colon 12/13/2014  . H/O malignant neoplasm of breast 12/13/2014  . Osteoporosis without current pathological fracture 12/13/2014  . 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 Personal history of skin cancer 01/22/2012    Allergies  Allergen Reactions  . Atorvastatin     Other reaction(s): Other (See Comments) alopecia  . Deltasone [Prednisone] Other (See Comments)    Turned red after joint injection  . Simvastatin     Other reaction(s): Muscle Pain  . Tape     Other reaction(s): Unknown  . Codeine Nausea Only    Other reaction(s): UNKNOWN  . Quinolones Rash    Past Surgical History:  Procedure Laterality Date  . BASAL CELL CARCINOMA EXCISION  07/2011  . 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 LAPAROSCOPIC HYSTERECTOMY  1980   partial  . MASTECTOMY Left 02/2009  . MASTECTOMY MODIFIED RADICAL Right 07/1986  . RETINAL DETACHMENT SURGERY  07/2011    Social History   Tobacco Use  . Smoking status: Former Smoker    Packs/day: 0.50    Years: 10.00    Pack years: 5.00    Types: Cigarettes  . Smokeless tobacco: Never Used  Vaping Use  . Vaping Use: Never used  Substance Use Topics  . Alcohol use: No    Alcohol/week: 0.0 standard drinks  . Drug use: No     Medication list has been reviewed and updated.  Current Meds  Medication Sig  . acetaminophen (TYLENOL) 650 MG CR tablet Take 650 mg by mouth every morning.  Marland Kitchen aspirin 81 MG chewable tablet Chew by mouth.  . Biotin 5 MG CAPS Take by  mouth.  . Calcium-Vitamin D-Vitamin K [phone] MG-UNT-MCG CHEW Chew 1 tablet by mouth daily. 600-125  . Coenzyme Q10 100 MG TABS Take by mouth.  . cyanocobalamin 1000 MCG tablet Take by mouth.  Tery Sanfilippo Calcium (STOOL SOFTENER PO) Take 50 mg by mouth.  . famotidine (PEPCID) 20 MG tablet TAKE 1 TABLET BY MOUTH TWICE A DAY  . furosemide (LASIX) 40 MG tablet Take 1 tablet by mouth daily. On Mondays and Fridays  . lisinopril (ZESTRIL) 10 MG tablet Take 1 tablet by mouth daily.  . metoprolol tartrate (LOPRESSOR) 25 MG tablet Take 1 tablet by mouth 2 (two) times daily.  . nitroGLYCERIN (NITROSTAT) 0.4 MG SL tablet Place 1 tablet under the tongue as needed.  . quiNIDine sulfate 300 MG tablet Take 1 tablet by mouth 2 (two) times daily.  . rosuvastatin (CRESTOR) 20 MG tablet Take 20 mg by mouth daily.  Marland Kitchen spironolactone (ALDACTONE) 25 MG tablet Take 0.5 tablets by mouth daily.  . [DISCONTINUED] alendronate (FOSAMAX) 70 MG tablet TAKE 1 TABLET BY MOUTH ONE TIME PER WEEK  . [DISCONTINUED] magnesium oxide (MAG-OX) 400 MG tablet TAKE 1 TABLET BY MOUTH TWICE A DAY    PHQ 2/9 Scores 07/11/2020 06/27/2020 07/07/2019 06/27/2019  PHQ - 2 Score 0 0 0 0  PHQ- 9 Score 0 - 0 -    GAD 7 : Generalized Anxiety Score 07/11/2020  Nervous, Anxious, on Edge 0  Control/stop worrying 0  Worry too much - different things 0  Trouble relaxing 0  Restless 0  Easily annoyed or irritable 0  Afraid - awful might happen 0  Total GAD 7 Score 0    BP Readings from Last 3 Encounters:  07/11/20 110/74  06/27/20 118/66  07/07/19 124/72    Physical Exam Vitals and nursing note reviewed.  Constitutional:      General: She is not in acute distress.    Appearance: She is well-developed.  HENT:     Head: Normocephalic and atraumatic.     Right Ear: Tympanic membrane and ear canal normal.     Left Ear: Tympanic membrane and ear canal normal.     Nose:     Right Sinus: No maxillary sinus tenderness.     Left  Sinus: No maxillary sinus tenderness.  Eyes:     General: No scleral icterus.       Right eye: No discharge.        Left eye: No discharge.     Conjunctiva/sclera: Conjunctivae normal.  Neck:     Thyroid: No thyromegaly.     Vascular: No carotid bruit.  Cardiovascular:     Rate and Rhythm: Normal rate and regular rhythm.  No extrasystoles are present.    Pulses: Normal pulses.     Heart sounds: Murmur heard.   Systolic murmur is present with a grade of 3/6. No gallop.   Pulmonary:     Effort: Pulmonary effort is normal. No respiratory distress.     Breath sounds: No wheezing.  Chest:  Breasts:     Right: Absent.     Left: Absent.      Comments: Right implant hard/firm and tender  Left axilla - no mass; tender at the proximal humerus; no lymphadenopathy  Pacemaker in left upper chest  Port in right upper chest Abdominal:     General: Bowel sounds are normal.     Palpations: Abdomen is soft.     Tenderness: There is no abdominal tenderness.  Musculoskeletal:     Cervical back: Normal range of motion. No erythema.     Right lower leg: No edema.     Left lower leg: No edema.  Lymphadenopathy:     Cervical: No cervical adenopathy.  Skin:    General: Skin is warm and dry.     Findings: No rash.  Neurological:     Mental Status: She is alert and oriented to person, place, and time.     Cranial Nerves: No cranial nerve deficit.     Sensory: No sensory deficit.     Motor: Motor function is intact.     Deep Tendon Reflexes: Reflexes are normal and symmetric.  Psychiatric:        Attention and Perception: Attention normal.        Mood and Affect: Mood normal.     Wt Readings from Last 3 Encounters:  07/11/20 153 lb (69.4 kg)  06/27/20 153 lb 6.4 oz (69.6 kg)  07/07/19 152 lb (68.9 kg)    BP 110/74   Pulse 60   Temp 98.3 F (36.8 C) (Oral)   Ht 5\' 3"  (1.6 m)   Wt 153 lb (69.4 kg)   SpO2 98%   BMI 27.10 kg/m   Assessment and  Plan: 1. 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 Essential (primary) hypertension Clinically stable exam with well controlled BP. Tolerating medications without side effects at this time. Pt to continue current regimen and low sodium diet; benefits of regular exercise as able discussed. - CBC with Differential/Platelet - Comprehensive metabolic panel - TSH  3. Gastroesophageal reflux disease with esophagitis without hemorrhage Genella Rife sx despite once a day pepcid Recommend increasing to bid No red flag symptoms noted - CBC with Differential/Platelet  4. Chronic renal insufficiency, stage 3 (moderate) (HCC) Continue to monitor; avoid NSAIDS - Comprehensive metabolic panel  5. Dyslipidemia - Lipid panel  6. Need for hepatitis C screening test - Hepatitis C antibody  7. Chronic systolic HF (heart failure) (HCC) Followed by Cardiology Symptoms stable  8. Coronary artery disease of native artery of native heart with stable angina pectoris (HCC)  9. Capsular contracture of breast implant, initial encounter This is causing her discomfort when sleeping on the right side Recommend trying to sleep supine - otherwise consider Plastic surgery evaluation for possible removal  10. Axillary pain, left This appears to be soft tissue strain at the shoulder No evidence of mass or skin recurrence Recommend tylenol, heat and avoid straining/recurrent palpation   Partially dictated using Animal nutritionist. Any errors are unintentional.  Bari Edward, MD [facility]  07/11/2020

## 2020-07-12 LAB — CBC WITH DIFFERENTIAL/PLATELET
Basophils Absolute: 0 10*3/uL (ref 0.0–0.2)
Basos: 0 %
EOS (ABSOLUTE): 0.2 10*3/uL (ref 0.0–0.4)
Eos: 2 %
Hematocrit: 38 % (ref 34.0–46.6)
Hemoglobin: 13.2 g/dL (ref 11.1–15.9)
Immature Grans (Abs): 0 10*3/uL (ref 0.0–0.1)
Immature Granulocytes: 0 %
Lymphocytes Absolute: 1.8 10*3/uL (ref 0.7–3.1)
Lymphs: 24 %
MCH: 30.6 pg (ref 26.6–33.0)
MCHC: 34.7 g/dL (ref 31.5–35.7)
MCV: 88 fL (ref 79–97)
Monocytes Absolute: 0.7 10*3/uL (ref 0.1–0.9)
Monocytes: 9 %
Neutrophils Absolute: 4.7 10*3/uL (ref 1.4–7.0)
Neutrophils: 65 %
Platelets: 181 10*3/uL (ref 150–450)
RBC: 4.32 x10E6/uL (ref 3.77–5.28)
RDW: 13.7 % (ref 11.7–15.4)
WBC: 7.3 10*3/uL (ref 3.4–10.8)

## 2020-07-12 LAB — LIPID PANEL
Chol/HDL Ratio: 2 ratio (ref 0.0–4.4)
Cholesterol, Total: 168 mg/dL (ref 100–199)
HDL: 84 mg/dL (ref >39)
LDL Chol Calc (NIH): 69 mg/dL (ref 0–99)
Triglycerides: 84 mg/dL (ref 0–149)
VLDL Cholesterol Cal: 15 mg/dL (ref 5–40)

## 2020-07-12 LAB — COMPREHENSIVE METABOLIC PANEL
ALT: 14 IU/L (ref 0–32)
AST: 15 IU/L (ref 0–40)
Albumin/Globulin Ratio: 2.1 (ref 1.2–2.2)
Albumin: 4.4 g/dL (ref 3.7–4.7)
Alkaline Phosphatase: 53 IU/L (ref 44–121)
BUN/Creatinine Ratio: 13 (ref 12–28)
BUN: 18 mg/dL (ref 8–27)
Bilirubin Total: 0.5 mg/dL (ref 0.0–1.2)
CO2: 24 mmol/L (ref 20–29)
Calcium: 9.9 mg/dL (ref 8.7–10.3)
Chloride: 100 mmol/L (ref 96–106)
Creatinine, Ser: 1.42 mg/dL — ABNORMAL HIGH (ref 0.57–1.00)
GFR calc Af Amer: 41 mL/min/{1.73_m2} — ABNORMAL LOW (ref >59)
GFR calc non Af Amer: 36 mL/min/{1.73_m2} — ABNORMAL LOW (ref >59)
Globulin, Total: 2.1 g/dL (ref 1.5–4.5)
Glucose: 94 mg/dL (ref 65–99)
Potassium: 5.1 mmol/L (ref 3.5–5.2)
Sodium: 139 mmol/L (ref 134–144)
Total Protein: 6.5 g/dL (ref 6.0–8.5)

## 2020-07-12 LAB — TSH: TSH: 2.36 u[IU]/mL (ref 0.450–4.500)

## 2020-07-12 LAB — HEPATITIS C ANTIBODY: Hep C Virus Ab: <0.1 s/co ratio (ref 0.0–0.9)

## 2020-08-03 ENCOUNTER — Other Ambulatory Visit: Payer: Self-pay | Admitting: Internal Medicine

## 2020-08-03 DIAGNOSIS — K2100 Gastro-esophageal reflux disease with esophagitis, without bleeding: Secondary | ICD-10-CM

## 2020-08-28 DIAGNOSIS — Z85828 Personal history of other malignant neoplasm of skin: Secondary | ICD-10-CM | POA: Diagnosis not present

## 2020-08-28 DIAGNOSIS — L578 Other skin changes due to chronic exposure to nonionizing radiation: Secondary | ICD-10-CM | POA: Diagnosis not present

## 2020-08-28 DIAGNOSIS — D229 Melanocytic nevi, unspecified: Secondary | ICD-10-CM | POA: Diagnosis not present

## 2020-08-28 DIAGNOSIS — L820 Inflamed seborrheic keratosis: Secondary | ICD-10-CM | POA: Diagnosis not present

## 2020-08-28 DIAGNOSIS — L814 Other melanin hyperpigmentation: Secondary | ICD-10-CM | POA: Diagnosis not present

## 2020-10-03 DIAGNOSIS — Z4502 Encounter for adjustment and management of automatic implantable cardiac defibrillator: Secondary | ICD-10-CM | POA: Diagnosis not present

## 2020-10-03 DIAGNOSIS — Z95810 Presence of automatic (implantable) cardiac defibrillator: Secondary | ICD-10-CM | POA: Diagnosis not present

## 2020-10-03 DIAGNOSIS — I5022 Chronic systolic (congestive) heart failure: Secondary | ICD-10-CM | POA: Diagnosis not present

## 2020-10-05 DIAGNOSIS — I5022 Chronic systolic (congestive) heart failure: Secondary | ICD-10-CM | POA: Diagnosis not present

## 2020-10-05 DIAGNOSIS — M170 Bilateral primary osteoarthritis of knee: Secondary | ICD-10-CM | POA: Diagnosis not present

## 2020-10-05 DIAGNOSIS — Z95810 Presence of automatic (implantable) cardiac defibrillator: Secondary | ICD-10-CM | POA: Diagnosis not present

## 2020-10-05 DIAGNOSIS — I25118 Atherosclerotic heart disease of native coronary artery with other forms of angina pectoris: Secondary | ICD-10-CM | POA: Diagnosis not present

## 2020-10-05 DIAGNOSIS — I2510 Atherosclerotic heart disease of native coronary artery without angina pectoris: Secondary | ICD-10-CM | POA: Diagnosis not present

## 2020-11-12 DIAGNOSIS — Z923 Personal history of irradiation: Secondary | ICD-10-CM | POA: Diagnosis not present

## 2020-11-12 DIAGNOSIS — I252 Old myocardial infarction: Secondary | ICD-10-CM | POA: Diagnosis not present

## 2020-11-12 DIAGNOSIS — Z9013 Acquired absence of bilateral breasts and nipples: Secondary | ICD-10-CM | POA: Diagnosis not present

## 2020-11-12 DIAGNOSIS — M810 Age-related osteoporosis without current pathological fracture: Secondary | ICD-10-CM | POA: Diagnosis not present

## 2020-11-12 DIAGNOSIS — Z08 Encounter for follow-up examination after completed treatment for malignant neoplasm: Secondary | ICD-10-CM | POA: Diagnosis not present

## 2020-11-12 DIAGNOSIS — M8580 Other specified disorders of bone density and structure, unspecified site: Secondary | ICD-10-CM | POA: Diagnosis not present

## 2020-11-12 DIAGNOSIS — R079 Chest pain, unspecified: Secondary | ICD-10-CM | POA: Diagnosis not present

## 2020-11-12 DIAGNOSIS — Z170 Estrogen receptor positive status [ER+]: Secondary | ICD-10-CM | POA: Diagnosis not present

## 2020-11-12 DIAGNOSIS — C50412 Malignant neoplasm of upper-outer quadrant of left female breast: Secondary | ICD-10-CM | POA: Diagnosis not present

## 2020-11-12 DIAGNOSIS — R634 Abnormal weight loss: Secondary | ICD-10-CM | POA: Diagnosis not present

## 2020-11-12 DIAGNOSIS — R0602 Shortness of breath: Secondary | ICD-10-CM | POA: Diagnosis not present

## 2021-01-02 DIAGNOSIS — Z95810 Presence of automatic (implantable) cardiac defibrillator: Secondary | ICD-10-CM | POA: Diagnosis not present

## 2021-01-02 DIAGNOSIS — I5022 Chronic systolic (congestive) heart failure: Secondary | ICD-10-CM | POA: Diagnosis not present

## 2021-01-02 DIAGNOSIS — Z4502 Encounter for adjustment and management of automatic implantable cardiac defibrillator: Secondary | ICD-10-CM | POA: Diagnosis not present

## 2021-02-12 DIAGNOSIS — Z452 Encounter for adjustment and management of vascular access device: Secondary | ICD-10-CM | POA: Diagnosis not present

## 2021-03-05 DIAGNOSIS — Z23 Encounter for immunization: Secondary | ICD-10-CM | POA: Diagnosis not present

## 2021-04-03 DIAGNOSIS — Z4502 Encounter for adjustment and management of automatic implantable cardiac defibrillator: Secondary | ICD-10-CM | POA: Diagnosis not present

## 2021-04-03 DIAGNOSIS — Z95810 Presence of automatic (implantable) cardiac defibrillator: Secondary | ICD-10-CM | POA: Diagnosis not present

## 2021-04-03 DIAGNOSIS — I5022 Chronic systolic (congestive) heart failure: Secondary | ICD-10-CM | POA: Diagnosis not present

## 2021-05-01 DIAGNOSIS — Z95810 Presence of automatic (implantable) cardiac defibrillator: Secondary | ICD-10-CM | POA: Diagnosis not present

## 2021-05-01 DIAGNOSIS — I420 Dilated cardiomyopathy: Secondary | ICD-10-CM | POA: Diagnosis not present

## 2021-05-01 DIAGNOSIS — I5022 Chronic systolic (congestive) heart failure: Secondary | ICD-10-CM | POA: Diagnosis not present

## 2021-05-01 DIAGNOSIS — I255 Ischemic cardiomyopathy: Secondary | ICD-10-CM | POA: Diagnosis not present

## 2021-05-15 DIAGNOSIS — Z452 Encounter for adjustment and management of vascular access device: Secondary | ICD-10-CM | POA: Diagnosis not present

## 2021-07-03 ENCOUNTER — Ambulatory Visit (INDEPENDENT_AMBULATORY_CARE_PROVIDER_SITE_OTHER): Payer: Medicare PPO

## 2021-07-03 DIAGNOSIS — Z0000 Encounter for general adult medical examination without abnormal findings: Secondary | ICD-10-CM

## 2021-07-03 NOTE — Patient Instructions (Signed)
Kristen Blanchard , Thank you for taking time to come for your Medicare Wellness Visit. I appreciate your ongoing commitment to your health goals. Please review the following plan we discussed and let me know if I can assist you in the future.   Screening recommendations/referrals: Colonoscopy: no longer required Bone Density: done 04/08/17 Recommended yearly ophthalmology/optometry visit for glaucoma screening and checkup Recommended yearly dental visit for hygiene and checkup  Vaccinations: Influenza vaccine: declined Pneumococcal vaccine: declined Tdap vaccine: due Shingles vaccine: Shingrix discussed. Please contact your pharmacy for coverage information.  Covid-19:done 09/23/19, 10/13/19, 05/24/20 & 03/05/21   Conditions/risks identified: Recommend drinking 6-8 glasses of water per day   Next appointment: Follow up in one year for your annual wellness visit    Preventive Care 65 Years and Older, Female Preventive care refers to lifestyle choices and visits with your health care provider that can promote health and wellness. What does preventive care include? A yearly physical exam. This is also called an annual well check. Dental exams once or twice a year. Routine eye exams. Ask your health care provider how often you should have your eyes checked. Personal lifestyle choices, including: Daily care of your teeth and gums. Regular physical activity. Eating a healthy diet. Avoiding tobacco and drug use. Limiting alcohol use. Practicing safe sex. Taking low-dose aspirin every day. Taking vitamin and mineral supplements as recommended by your health care provider. What happens during an annual well check? The services and screenings done by your health care provider during your annual well check will depend on your age, overall health, lifestyle risk factors, and family history of disease. Counseling  Your health care provider may ask you questions about your: Alcohol use. Tobacco  use. Drug use. Emotional well-being. Home and relationship well-being. Sexual activity. Eating habits. History of falls. Memory and ability to understand (cognition). Work and work Astronomer. Reproductive health. Screening  You may have the following tests or measurements: Height, weight, and BMI. Blood pressure. Lipid and cholesterol levels. These may be checked every 5 years, or more frequently if you are over 63 years old. Skin check. Lung cancer screening. You may have this screening every year starting at age 78 if you have a 30-pack-year history of smoking and currently smoke or have quit within the past 15 years. Fecal occult blood test (FOBT) of the stool. You may have this test every year starting at age 31. Flexible sigmoidoscopy or colonoscopy. You may have a sigmoidoscopy every 5 years or a colonoscopy every 10 years starting at age 98. Hepatitis C blood test. Hepatitis B blood test. Sexually transmitted disease (STD) testing. Diabetes screening. This is done by checking your blood sugar (glucose) after you have not eaten for a while (fasting). You may have this done every 1-3 years. Bone density scan. This is done to screen for osteoporosis. You may have this done starting at age 59. Mammogram. This may be done every 1-2 years. Talk to your health care provider about how often you should have regular mammograms. Talk with your health care provider about your test results, treatment options, and if necessary, the need for more tests. Vaccines  Your health care provider may recommend certain vaccines, such as: Influenza vaccine. This is recommended every year. Tetanus, diphtheria, and acellular pertussis (Tdap, Td) vaccine. You may need a Td booster every 10 years. Zoster vaccine. You may need this after age 76. Pneumococcal 13-valent conjugate (PCV13) vaccine. One dose is recommended after age 27. Pneumococcal polysaccharide (PPSV23) vaccine. One  dose is recommended  after age 78. Talk to your health care provider about which screenings and vaccines you need and how often you need them. This information is not intended to replace advice given to you by your health care provider. Make sure you discuss any questions you have with your health care provider. Document Released: 08/10/2015 Document Revised: 04/02/2016 Document Reviewed: 05/15/2015 Elsevier Interactive Patient Education  2017 [place] Prevention in the Home Falls can cause injuries. They can happen to people of all ages. There are many things you can do to make your home safe and to help prevent falls. What can I do on the outside of my home? Regularly fix the edges of walkways and driveways and fix any cracks. Remove anything that might make you trip as you walk through a door, such as a raised step or threshold. Trim any bushes or trees on the path to your home. Use bright outdoor lighting. Clear any walking paths of anything that might make someone trip, such as rocks or tools. Regularly check to see if handrails are loose or broken. Make sure that both sides of any steps have handrails. Any raised decks and porches should have guardrails on the edges. Have any leaves, snow, or ice cleared regularly. Use sand or salt on walking paths during winter. Clean up any spills in your garage right away. This includes oil or grease spills. What can I do in the bathroom? Use night lights. Install grab bars by the toilet and in the tub and shower. Do not use towel bars as grab bars. Use non-skid mats or decals in the tub or shower. If you need to sit down in the shower, use a plastic, non-slip stool. Keep the floor dry. Clean up any water that spills on the floor as soon as it happens. Remove soap buildup in the tub or shower regularly. Attach bath mats securely with double-sided non-slip rug tape. Do not have throw rugs and other things on the floor that can make you trip. What can I do  in the bedroom? Use night lights. Make sure that you have a light by your bed that is easy to reach. Do not use any sheets or blankets that are too big for your bed. They should not hang down onto the floor. Have a firm chair that has side arms. You can use this for support while you get dressed. Do not have throw rugs and other things on the floor that can make you trip. What can I do in the kitchen? Clean up any spills right away. Avoid walking on wet floors. Keep items that you use a lot in easy-to-reach places. If you need to reach something above you, use a strong step stool that has a grab bar. Keep electrical cords out of the way. Do not use floor polish or wax that makes floors slippery. If you must use wax, use non-skid floor wax. Do not have throw rugs and other things on the floor that can make you trip. What can I do with my stairs? Do not leave any items on the stairs. Make sure that there are handrails on both sides of the stairs and use them. Fix handrails that are broken or loose. Make sure that handrails are as long as the stairways. Check any carpeting to make sure that it is firmly attached to the stairs. Fix any carpet that is loose or worn. Avoid having throw rugs at the top or bottom of the  stairs. If you do have throw rugs, attach them to the floor with carpet tape. Make sure that you have a light switch at the top of the stairs and the bottom of the stairs. If you do not have them, ask someone to add them for you. What else can I do to help prevent falls? Wear shoes that: Do not have high heels. Have rubber bottoms. Are comfortable and fit you well. Are closed at the toe. Do not wear sandals. If you use a stepladder: Make sure that it is fully opened. Do not climb a closed stepladder. Make sure that both sides of the stepladder are locked into place. Ask someone to hold it for you, if possible. Clearly mark and make sure that you can see: Any grab bars or  handrails. First and last steps. Where the edge of each step is. Use tools that help you move around (mobility aids) if they are needed. These include: Canes. Walkers. Scooters. Crutches. Turn on the lights when you go into a dark area. Replace any light bulbs as soon as they burn out. Set up your furniture so you have a clear path. Avoid moving your furniture around. If any of your floors are uneven, fix them. If there are any pets around you, be aware of where they are. Review your medicines with your doctor. Some medicines can make you feel dizzy. This can increase your chance of falling. Ask your doctor what other things that you can do to help prevent falls. This information is not intended to replace advice given to you by your health care provider. Make sure you discuss any questions you have with your health care provider. Document Released: 05/10/2009 Document Revised: 12/20/2015 Document Reviewed: 08/18/2014 Elsevier Interactive Patient Education  2017 [employer].

## 2021-07-03 NOTE — Progress Notes (Signed)
Subjective:   Kristen Blanchard is a 80 y.o. female who presents for Medicare Annual (Subsequent) preventive examination.  Virtual Visit via Telephone Note  I connected with  Kristen Blanchard on 07/03/21 at 10:40 AM EST by telephone and verified that I am speaking with the correct person using two identifiers.  Location: Patient: home Provider: [facility] Persons participating in the virtual visit: patient/Nurse Health Advisor   I discussed the limitations, risks, security and privacy concerns of performing an evaluation and management service by telephone and the availability of in person appointments. The patient expressed understanding and agreed to proceed.  Interactive audio and video telecommunications were attempted between this nurse and patient, however failed, due to patient having technical difficulties OR patient did not have access to video capability.  We continued and completed visit with audio only.  Some vital signs may be absent or patient reported.   Kristen Littler, LPN   Review of Systems     Cardiac Risk Factors include: advanced age (>64men, >31 women);dyslipidemia;hypertension;sedentary lifestyle     Objective:    Today's Vitals   07/03/21 1043  PainSc: 5    There is no height or weight on file to calculate BMI.  Advanced Directives 07/03/2021 06/27/2020 06/27/2019 06/25/2017 06/13/2016  Does Patient Have a Medical Advance Directive? Yes Yes No Yes No  Type of Estate agent of [place];Living will Healthcare Power of [place];Living will - - -  Does patient want to make changes to medical advance directive? - - - Yes (MAU/Ambulatory/Procedural Areas - Information given) -  Copy of Healthcare Power of Attorney in Chart? Yes - validated most recent copy scanned in chart (See row information) No - copy requested - - -  Would patient like information on creating a medical advance directive? - - No - Patient declined - -    Current Medications  (verified) Outpatient Encounter Medications as of 07/03/2021  Medication Sig   acetaminophen (TYLENOL) 650 MG CR tablet Take 650 mg by mouth every morning.   aspirin 81 MG chewable tablet Chew by mouth.   Biotin 5 MG CAPS Take by mouth.   Calcium-Vitamin D-Vitamin K [phone] MG-UNT-MCG CHEW Chew 1 tablet by mouth daily. 600-125   Coenzyme Q10 100 MG TABS Take by mouth.   cyanocobalamin 1000 MCG tablet Take by mouth.   Docusate Calcium (STOOL SOFTENER PO) Take 50 mg by mouth.   famotidine (PEPCID) 20 MG tablet TAKE 1 TABLET BY MOUTH TWICE A DAY   furosemide (LASIX) 40 MG tablet Take 1 tablet by mouth daily. On Mondays and Fridays   lisinopril (ZESTRIL) 10 MG tablet Take 1 tablet by mouth daily.   metoprolol tartrate (LOPRESSOR) 25 MG tablet Take 1 tablet by mouth 2 (two) times daily.   nitroGLYCERIN (NITROSTAT) 0.4 MG SL tablet Place 1 tablet under the tongue as needed.   rosuvastatin (CRESTOR) 20 MG tablet Take 20 mg by mouth daily.   spironolactone (ALDACTONE) 25 MG tablet Take 0.5 tablets by mouth daily.   [DISCONTINUED] quiNIDine sulfate 300 MG tablet Take 1 tablet by mouth 2 (two) times daily.   No facility-administered encounter medications on file as of 07/03/2021.    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   LAPAROSCOPIC HYSTERECTOMY  1980   partial   MASTECTOMY Left 02/2009   MASTECTOMY MODIFIED RADICAL Right 07/1986   RETINAL DETACHMENT SURGERY  07/2011   Family History  Problem Relation Age of Onset   Cancer Mother        Breast   Cancer Father        lung   Rheum arthritis Brother    Healthy Brother    Social History   Socioeconomic History   Marital  status: Married    Spouse name: Not on file   Number of children: 0   Years of education: Not on file   Highest education level: Not on file  Occupational History   Occupation: Retired  Tobacco Use   Smoking status: Former    Packs/day: 0.50    Years: 10.00    Pack years: 5.00    Types: Cigarettes   Smokeless tobacco: Never  Vaping Use   Vaping Use: Never used  Substance and Sexual Activity   Alcohol use: No    Alcohol/week: 0.0 standard drinks   Drug use: No   Sexual activity: Not Currently  Other Topics Concern   Not on file  Social History Narrative   Not on file   Social Determinants of Health   Financial Resource Strain: Low Risk    Difficulty of Paying Living Expenses: Not hard at all  Food Insecurity: No Food Insecurity   Worried About Programme researcher, broadcasting/film/video in the Last Year: Never true   Ran Out of Food in the Last Year: Never true  Transportation Needs: No Transportation Needs   Lack of Transportation (Medical): No   Lack of Transportation (Non-Medical): No  Physical Activity: Inactive   Days of Exercise per Week: 0 days   Minutes of Exercise per Session: 0 min  Stress: No Stress Concern Present   Feeling of Stress : Not at all  Social Connections: Moderately Integrated   Frequency of Communication with Friends and Family: More than three times a week   Frequency of Social Gatherings with Friends and Family: Once a week   Attends Religious Services: More than 4 times per year   Active Member of [employer] or Organizations: No   Attends Engineer, structural: Never   Marital Status: Married    Tobacco Counseling Counseling given: Not Answered   Clinical Intake:  Pre-visit preparation completed: Yes  Pain : 0-10 Pain Score: 5  Pain Type: Chronic pain Pain Location: Knee Pain Orientation: Right, Left Pain Descriptors / Indicators: Aching, Sore Pain Onset: More than a month ago Pain Frequency: Constant     Nutritional Risks: None Diabetes:  No  How often do you need to have someone help you when you read instructions, pamphlets, or other written materials from your doctor or pharmacy?: 1 - Never    Interpreter Needed?: No  Information entered by :: Kristen Littler LPN   Activities of Daily Living In your present state of health, do you have any difficulty performing the following activities: 07/03/2021  Hearing? N  Vision? N  Difficulty concentrating or making decisions? N  Walking or climbing stairs? Y  Dressing or bathing? N  Doing errands, shopping? N  Preparing Food and eating ? N  Using the Toilet? N  In the past six months, have you accidently leaked urine? Y  Comment wears pads for protection  Do you have problems with loss of bowel control? N  Managing your Medications?  N  Managing your Finances? N  Housekeeping or managing your Housekeeping? N  Some recent data might be hidden    Patient Care Team: Reubin Milan, MD as PCP - General (Internal Medicine) Hardie-Hood, Murvin Natal, MD as Referring Physician (Oncology) Gae Gallop, MD as Referring Physician (Cardiology)  Indicate any recent Medical Services you may have received from other than [facility] providers in the past year (date may be approximate).     Assessment:   This is a routine wellness examination for Kristen Blanchard.  Hearing/Vision screen Hearing Screening - Comments:: Pt denies hearing difficulty Vision Screening - Comments::  Annual vision screenings done by Dr. Charlsie Merles  Dietary issues and exercise activities discussed: Current Exercise Habits: The patient does not participate in regular exercise at present, Exercise limited by: orthopedic condition(s)   Goals Addressed             This Visit's Progress    DIET - INCREASE WATER INTAKE       Recommend drinking 6-8 glasses of water per day        Depression Screen PHQ 2/9 Scores 07/03/2021 07/11/2020 06/27/2020 07/07/2019 06/27/2019 07/01/2018 06/25/2017  PHQ - 2 Score 0 0 0  0 0 0 0  PHQ- 9 Score - 0 - 0 - 3 -    Fall Risk Fall Risk  07/03/2021 07/11/2020 06/27/2020 06/27/2019 07/01/2018  Falls in the past year? 0 0 0 0 1  Comment - - - - passed out but didn't hit floor  Number falls in past yr: 0 - 0 0 0  Injury with Fall? 0 - 0 0 0  Risk for fall due to : No Fall Risks - Impaired balance/gait Orthopedic patient -  Follow up Falls prevention discussed Falls evaluation completed Falls prevention discussed Falls prevention discussed Falls evaluation completed    FALL RISK PREVENTION PERTAINING TO THE HOME:  Any stairs in or around the home? Yes  If so, are there any without handrails? No  Home free of loose throw rugs in walkways, pet beds, electrical cords, etc? Yes  Adequate lighting in your home to reduce risk of falls? Yes   ASSISTIVE DEVICES UTILIZED TO PREVENT FALLS:  Life alert? No  Use of a cane, walker or w/c? Yes  Grab bars in the bathroom? Yes  Shower chair or bench in shower? No  Elevated toilet seat or a handicapped toilet? Yes   TIMED UP AND GO:  Was the test performed? No . Telephonic visit.   Cognitive Function: Normal cognitive status assessed by direct observation by this Nurse Health Advisor. No abnormalities found.       6CIT Screen 06/27/2019 06/25/2017 06/13/2016  What Year? 0 points 0 points 0 points  What month? 0 points 0 points 0 points  What time? 0 points 0 points 0 points  Count back from 20 0 points 0 points 0 points  Months in reverse 0 points 0 points 0 points  Repeat phrase 0 points 0 points 0 points  Total Score 0 0 0    Immunizations Immunization History  Administered Date(s) Administered   PFIZER Comirnaty(Gray Top)Covid-19 Tri-Sucrose Vaccine 03/05/2021   PFIZER(Purple Top)SARS-COV-2 Vaccination 09/23/2019, 10/13/2019, 05/24/2020    TDAP status: Due, Education has been provided regarding the importance of this vaccine. Advised may receive this vaccine at local pharmacy or Health Dept. Aware to provide  a copy of the vaccination record if obtained from local pharmacy or Health Dept. Verbalized acceptance and understanding.  Flu Vaccine status:  Declined, Education has been provided regarding the importance of this vaccine but patient still declined. Advised may receive this vaccine at local pharmacy or Health Dept. Aware to provide a copy of the vaccination record if obtained from local pharmacy or Health Dept. Verbalized acceptance and understanding.  Pneumococcal vaccine status: Declined,  Education has been provided regarding the importance of this vaccine but patient still declined. Advised may receive this vaccine at local pharmacy or Health Dept. Aware to provide a copy of the vaccination record if obtained from local pharmacy or Health Dept. Verbalized acceptance and understanding.   Covid-19 vaccine status: Completed vaccines  Qualifies for Shingles Vaccine? Yes   Zostavax completed No   Shingrix Completed?: No.    Education has been provided regarding the importance of this vaccine. Patient has been advised to call insurance company to determine out of pocket expense if they have not yet received this vaccine. Advised may also receive vaccine at local pharmacy or Health Dept. Verbalized acceptance and understanding.  Screening Tests Health Maintenance  Topic Date Due   Zoster Vaccines- Shingrix (1 of 2) Never done   COVID-19 Vaccine (5 - Booster for Pfizer series) 04/30/2021   TETANUS/TDAP  07/28/2021 (Originally 11/23/1959)   INFLUENZA VACCINE  10/25/2021 (Originally 02/25/2021)   Pneumonia Vaccine 3+ Years old (1 - PCV) 07/03/2022 (Originally 11/23/1946)   DEXA SCAN  Completed   Hepatitis C Screening  Completed   HPV VACCINES  Aged Out   COLONOSCOPY (Pts 45-48yrs Insurance coverage will need to be confirmed)  Discontinued    Health Maintenance  Health Maintenance Due  Topic Date Due   Zoster Vaccines- Shingrix (1 of 2) Never done   COVID-19 Vaccine (5 - Booster for Pfizer  series) 04/30/2021    Colorectal cancer screening: No longer required.   Mammogram status: No longer required due to mastectomy.  Bone Density status: Completed 04/08/17. Results reflect: Bone density results: OSTEOPENIA. Repeat every 2 years. Pt declines repeat screening at this time.   Lung Cancer Screening: (Low Dose CT Chest recommended if Age 57-80 years, 30 pack-year currently smoking OR have quit w/in 15years.) does not qualify.   Additional Screening:  Hepatitis C Screening: does qualify; Completed 07/11/20  Vision Screening: Recommended annual ophthalmology exams for early detection of glaucoma and other disorders of the eye. Is the patient up to date with their annual eye exam?  Yes  Who is the provider or what is the name of the office in which the patient attends annual eye exams? Dr. Ezzard Standing.   Dental Screening: Recommended annual dental exams for proper oral hygiene  Community Resource Referral / Chronic Care Management: CRR required this visit?  No   CCM required this visit?  No      Plan:     I have personally reviewed and noted the following in the patient's chart:   Medical and social history Use of alcohol, tobacco or illicit drugs  Current medications and supplements including opioid prescriptions.  Functional ability and status Nutritional status Physical activity Advanced directives List of other physicians Hospitalizations, surgeries, and ER visits in previous 12 months Vitals Screenings to include cognitive, depression, and falls Referrals and appointments  In addition, I have reviewed and discussed with patient certain preventive protocols, quality metrics, and best practice recommendations. A written personalized care plan for preventive services as well as general preventive health recommendations were provided to patient.     Kristen Littler, LPN   14/10/[id number]   Nurse Notes: pt c/o dry,  itchy scalp and tried different types of shampoo without  relief; scheduled with dermatology Feb 2023 but would like for Dr. Judithann Graves to assess at CPE.  Pt also c/o worsening acid reflux - currently taking famotidine 20 mg BID  Pt states intermittent pain in right side/back at waistline that feels "heavy" at times and concerned about kidneys  Pt requests recheck of B12 level at next appt on 07/16/21 due to decreased energy level.

## 2021-07-04 DIAGNOSIS — I25118 Atherosclerotic heart disease of native coronary artery with other forms of angina pectoris: Secondary | ICD-10-CM | POA: Diagnosis not present

## 2021-07-04 DIAGNOSIS — I25119 Atherosclerotic heart disease of native coronary artery with unspecified angina pectoris: Secondary | ICD-10-CM | POA: Diagnosis not present

## 2021-07-11 DIAGNOSIS — H40023 Open angle with borderline findings, high risk, bilateral: Secondary | ICD-10-CM | POA: Diagnosis not present

## 2021-07-11 DIAGNOSIS — Z961 Presence of intraocular lens: Secondary | ICD-10-CM | POA: Diagnosis not present

## 2021-07-16 ENCOUNTER — Encounter: Payer: Self-pay | Admitting: Internal Medicine

## 2021-07-16 ENCOUNTER — Ambulatory Visit (INDEPENDENT_AMBULATORY_CARE_PROVIDER_SITE_OTHER): Payer: Medicare PPO | Admitting: Internal Medicine

## 2021-07-16 ENCOUNTER — Other Ambulatory Visit: Payer: Self-pay

## 2021-07-16 VITALS — BP 128/70 | HR 62 | Ht 63.0 in | Wt 152.2 lb

## 2021-07-16 DIAGNOSIS — I10 Essential (primary) hypertension: Secondary | ICD-10-CM

## 2021-07-16 DIAGNOSIS — Z853 Personal history of malignant neoplasm of breast: Secondary | ICD-10-CM | POA: Diagnosis not present

## 2021-07-16 DIAGNOSIS — K2100 Gastro-esophageal reflux disease with esophagitis, without bleeding: Secondary | ICD-10-CM | POA: Diagnosis not present

## 2021-07-16 DIAGNOSIS — I25118 Atherosclerotic heart disease of native coronary artery with other forms of angina pectoris: Secondary | ICD-10-CM | POA: Diagnosis not present

## 2021-07-16 DIAGNOSIS — Z0000 Encounter for general adult medical examination without abnormal findings: Secondary | ICD-10-CM

## 2021-07-16 DIAGNOSIS — N183 Chronic kidney disease, stage 3 unspecified: Secondary | ICD-10-CM | POA: Diagnosis not present

## 2021-07-16 DIAGNOSIS — N2889 Other specified disorders of kidney and ureter: Secondary | ICD-10-CM

## 2021-07-16 DIAGNOSIS — E785 Hyperlipidemia, unspecified: Secondary | ICD-10-CM | POA: Diagnosis not present

## 2021-07-16 DIAGNOSIS — I5022 Chronic systolic (congestive) heart failure: Secondary | ICD-10-CM | POA: Diagnosis not present

## 2021-07-16 LAB — POCT URINALYSIS DIPSTICK
Bilirubin, UA: NEGATIVE
Blood, UA: NEGATIVE
Glucose, UA: NEGATIVE
Ketones, UA: NEGATIVE
Leukocytes, UA: NEGATIVE
Nitrite, UA: NEGATIVE
Protein, UA: NEGATIVE
Spec Grav, UA: 1.01 (ref 1.010–1.025)
Urobilinogen, UA: 0.2 E.U./dL
pH, UA: 5 (ref 5.0–8.0)

## 2021-07-16 MED ORDER — PANTOPRAZOLE SODIUM 40 MG PO TBEC: 40.0000 mg | DELAYED_RELEASE_TABLET | Freq: Two times a day (BID) | ORAL | 0 refills | Status: DC

## 2021-07-16 NOTE — Progress Notes (Signed)
Date:  07/16/2021   Name:  Kristen Blanchard   DOB:  [birth year]   MRN:  [id number]   Chief Complaint: Annual Exam (Breast Exam. ) Kristen Blanchard is a 80 y.o. female who presents today for her Complete Annual Exam. She feels well. She reports exercising. She reports she is sleeping fairly well.   Mammogram: discontinued DEXA: 2018 osteopenia Pap smear: discontinued Colonoscopy: 2015  Immunization History  Administered Date(s) Administered   PFIZER Comirnaty(Gray Top)Covid-19 Tri-Sucrose Vaccine 03/05/2021   PFIZER(Purple Top)SARS-COV-2 Vaccination 09/23/2019, 10/13/2019, 05/24/2020    Gastroesophageal Reflux She complains of dysphagia and heartburn. She reports no abdominal pain, no chest pain (left sided chest and shoulder blade discomfort for years), no coughing or no wheezing. This is a recurrent problem. The problem occurs occasionally. Pertinent negatives include no fatigue. She has tried a histamine-2 antagonist for the symptoms.  Hypertension This is a chronic problem. The problem is controlled. Associated symptoms include headaches and palpitations. Pertinent negatives include no chest pain (left sided chest and shoulder blade discomfort for years) or shortness of breath. Past treatments include diuretics, beta blockers and angiotensin blockers. Hypertensive end-organ damage includes kidney disease, CAD/MI and CVA.  Hyperlipidemia This is a chronic problem. The problem is controlled. Pertinent negatives include no chest pain (left sided chest and shoulder blade discomfort for years) or shortness of breath. Current antihyperlipidemic treatment includes statins.  Palpitations  This is a recurrent problem. The current episode started more than 1 year ago. The problem occurs 2 to 4 times per day. Pertinent negatives include no anxiety, chest pain (left sided chest and shoulder blade discomfort for years), coughing, dizziness, fever, shortness of breath or vomiting.   Lab Results   Component Value Date   NA 139 07/11/2020   K 5.1 07/11/2020   CO2 24 07/11/2020   GLUCOSE 94 07/11/2020   BUN 18 07/11/2020   CREATININE 1.42 (H) 07/11/2020   CALCIUM 9.9 07/11/2020   GFRNONAA 36 (L) 07/11/2020   Lab Results  Component Value Date   CHOL 168 07/11/2020   HDL 84 07/11/2020   LDLCALC 69 07/11/2020   TRIG 84 07/11/2020   CHOLHDL 2.0 07/11/2020   Lab Results  Component Value Date   TSH 2.360 07/11/2020   No results found for: HGBA1C Lab Results  Component Value Date   WBC 7.3 07/11/2020   HGB 13.2 07/11/2020   HCT 38.0 07/11/2020   MCV 88 07/11/2020   PLT 181 07/11/2020   Lab Results  Component Value Date   ALT 14 07/11/2020   AST 15 07/11/2020   ALKPHOS 53 07/11/2020   BILITOT 0.5 07/11/2020   Lab Results  Component Value Date   VD25OH 40.6 07/07/2019     Review of Systems  Constitutional:  Negative for chills, fatigue and fever.  HENT:  Negative for congestion, hearing loss, tinnitus, trouble swallowing and voice change.   Eyes:  Positive for visual disturbance (glaucome suspect).  Respiratory:  Negative for cough, chest tightness, shortness of breath and wheezing.   Cardiovascular:  Positive for palpitations. Negative for chest pain (left sided chest and shoulder blade discomfort for years) and leg swelling.  Gastrointestinal:  Positive for dysphagia and heartburn. Negative for abdominal distention, abdominal pain, constipation, diarrhea and vomiting.       Genella Rife occurring daily despite Pepcid and TUMS.   Endocrine: Negative for polydipsia and polyuria.  Genitourinary:  Positive for flank pain (right flank and CVA - feels like fluid sloshing every AM). Negative  for dysuria, frequency, genital sores, vaginal bleeding and vaginal discharge.  Musculoskeletal:  Negative for arthralgias, gait problem and joint swelling.  Skin:  Negative for color change and rash.  Neurological:  Positive for headaches. Negative for dizziness, tremors and  light-headedness.  Hematological:  Negative for adenopathy. Does not bruise/bleed easily.  Psychiatric/Behavioral:  Negative for dysphoric mood and sleep disturbance. The patient is not nervous/anxious.    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[place]) 06/08/2015   Dyslipidemia 12/13/2014   Essential (primary) hypertension 12/13/2014   H/O adenomatous polyp of colon 12/13/2014   H/O malignant neoplasm of breast 12/13/2014   Osteoporosis without current pathological fracture 12/13/2014   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   Personal history of skin cancer 01/22/2012    Allergies  Allergen Reactions   Atorvastatin     Other reaction(s): Other (See Comments) alopecia   Deltasone [Prednisone] Other (See Comments)    Turned red after joint injection   Simvastatin     Other reaction(s): Muscle Pain   Tape     Other reaction(s): Unknown   Codeine Nausea Only    Other reaction(s): UNKNOWN   Quinolones Rash    Past Surgical History:  Procedure Laterality Date   BASAL CELL CARCINOMA EXCISION  07/2011   CORONARY ARTERY BYPASS GRAFT  07/1979   LAPAROSCOPIC HYSTERECTOMY  1980   partial   MASTECTOMY Left 02/2009   MASTECTOMY MODIFIED RADICAL Right 07/1986   RETINAL DETACHMENT SURGERY  07/2011    Social History   Tobacco Use   Smoking  status: Former    Packs/day: 0.50    Years: 10.00    Pack years: 5.00    Types: Cigarettes   Smokeless tobacco: Never  Vaping Use   Vaping Use: Never used  Substance Use Topics   Alcohol use: No    Alcohol/week: 0.0 standard drinks   Drug use: No     Medication list has been reviewed and updated.  Current Meds  Medication Sig   acetaminophen (TYLENOL) 650 MG CR tablet Take 650 mg by mouth every morning.   aspirin 81 MG chewable tablet Chew by mouth.   Biotin 5 MG CAPS Take by mouth.   Calcium-Vitamin D-Vitamin K [id number] MG-UNT-MCG CHEW Chew 1 tablet by mouth daily. 600-125   Coenzyme Q10 100 MG TABS Take by mouth.   cyanocobalamin 1000 MCG tablet Take by mouth.   Docusate Calcium (STOOL SOFTENER PO) Take 50 mg by mouth.   famotidine (PEPCID) 20 MG tablet TAKE 1 TABLET BY MOUTH TWICE A DAY   furosemide (LASIX) 40 MG tablet Take 1 tablet by mouth daily. On Mondays and Fridays   lisinopril (ZESTRIL) 10 MG tablet Take 1 tablet by mouth daily.   metoprolol tartrate (LOPRESSOR) 25 MG tablet Take 1 tablet by mouth 2 (two) times daily.   nitroGLYCERIN (NITROSTAT) 0.4 MG SL tablet Place 1 tablet under the tongue as needed.  rosuvastatin (CRESTOR) 20 MG tablet Take 20 mg by mouth daily.   spironolactone (ALDACTONE) 25 MG tablet Take 0.5 tablets by mouth daily.    PHQ 2/9 Scores 07/03/2021 07/11/2020 06/27/2020 07/07/2019  PHQ - 2 Score 0 0 0 0  PHQ- 9 Score - 0 - 0    GAD 7 : Generalized Anxiety Score 07/11/2020  Nervous, Anxious, on Edge 0  Control/stop worrying 0  Worry too much - different things 0  Trouble relaxing 0  Restless 0  Easily annoyed or irritable 0  Afraid - awful might happen 0  Total GAD 7 Score 0    BP Readings from Last 3 Encounters:  07/16/21 128/70  07/11/20 110/74  06/27/20 118/66    Physical Exam Vitals and nursing note reviewed.  Constitutional:      General: She is not in acute distress.    Appearance: Normal appearance. She is  well-developed.  HENT:     Head: Normocephalic and atraumatic.     Right Ear: Tympanic membrane and ear canal normal.     Left Ear: Tympanic membrane and ear canal normal.     Nose:     Right Sinus: No maxillary sinus tenderness.     Left Sinus: No maxillary sinus tenderness.  Eyes:     General: No scleral icterus.       Right eye: No discharge.        Left eye: No discharge.     Conjunctiva/sclera: Conjunctivae normal.  Neck:     Thyroid: No thyromegaly.     Vascular: No carotid bruit.  Cardiovascular:     Rate and Rhythm: Normal rate and regular rhythm.     Pulses: Normal pulses.     Heart sounds: Normal heart sounds.  Pulmonary:     Effort: Pulmonary effort is normal. No respiratory distress.     Breath sounds: No wheezing.  Chest:  Breasts:    Right: Absent.     Left: Tenderness present.       Comments: Skin thickening on left due to radiation Right breast reconstruction - no mass No lymphadenopathy bilaterally  Abdominal:     General: Abdomen is flat. Bowel sounds are normal.     Palpations: Abdomen is soft.     Tenderness: There is abdominal tenderness in the left lower quadrant. There is no right CVA tenderness, left CVA tenderness, guarding or rebound.  Musculoskeletal:     Cervical back: Normal range of motion. No erythema.     Right lower leg: No edema.     Left lower leg: No edema.  Lymphadenopathy:     Cervical: No cervical adenopathy.  Skin:    General: Skin is warm and dry.     Findings: No rash.     Comments: Scalp normal - no lesions or scaling  Neurological:     Mental Status: She is alert and oriented to person, place, and time.     Cranial Nerves: No cranial nerve deficit.     Sensory: No sensory deficit.     Deep Tendon Reflexes: Reflexes are normal and symmetric.  Psychiatric:        Attention and Perception: Attention normal.        Mood and Affect: Mood normal.    Wt Readings from Last 3 Encounters:  07/16/21 152 lb 3.2 oz (69 kg)   07/11/20 153 lb (69.4 kg)  06/27/20 153 lb 6.4 oz (69.6 kg)    BP 128/70    Pulse 62  Ht 5\' 3"  (1.6 m)    Wt 152 lb 3.2 oz (69 kg)    SpO2 97%    BMI 26.96 kg/m   Assessment and Plan: 1. Annual physical exam Normal exam No explanation for right flank sx or scalp itching.  UA negative. Trial of Claritin to reduce pruritis.  2. Essential (primary) hypertension Clinically stable exam with well controlled BP. Tolerating medications without side effects at this time. Pt to continue current regimen and low sodium diet; benefits of regular exercise as able discussed. - Comprehensive metabolic panel - POCT urinalysis dipstick - TSH  3. Gastroesophageal reflux disease with esophagitis without hemorrhage Worsening daily sx without red flag symptoms. Will try pantoprazole bid for one month then resume Pepcid Call if sx persist. - CBC with Differential/Platelet - pantoprazole (PROTONIX) 40 MG tablet; Take 1 tablet (40 mg total) by mouth 2 (two) times daily.  Dispense: 60 tablet; Refill: 0  4. Chronic renal insufficiency, stage 3 (moderate) (HCC) Continue to monitor. Continue to avoid NSAIDS  5. Dyslipidemia Tolerating statin medication without side effects at this time LDL is at goal of < 70 on current dose Continue same therapy without change at this time. - Lipid panel  6. Chronic systolic HF (heart failure) (HCC) Stable on current medications. Followed by Cardiology. Palpitations since stopping Quinidine - monitor results pending.  7. Coronary artery disease of native artery of native heart with stable angina pectoris (HCC) Continue ASA 81 mg daily. Cardiology follow up.  8. H/O malignant neoplasm of breast Chronic chest wall changes without evidence of skin recurrence.   Partially dictated using Editor, commissioning. Any errors are unintentional.  Halina Maidens, MD [facility]  07/16/2021

## 2021-07-17 LAB — CBC WITH DIFFERENTIAL/PLATELET
Basophils Absolute: 0 10*3/uL (ref 0.0–0.2)
Basos: 0 %
EOS (ABSOLUTE): 0 10*3/uL (ref 0.0–0.4)
Eos: 0 %
Hematocrit: 42.1 % (ref 34.0–46.6)
Hemoglobin: 14.2 g/dL (ref 11.1–15.9)
Immature Grans (Abs): 0.1 10*3/uL (ref 0.0–0.1)
Immature Granulocytes: 1 %
Lymphocytes Absolute: 2.1 10*3/uL (ref 0.7–3.1)
Lymphs: 21 %
MCH: 30 pg (ref 26.6–33.0)
MCHC: 33.7 g/dL (ref 31.5–35.7)
MCV: 89 fL (ref 79–97)
Monocytes Absolute: 0.8 10*3/uL (ref 0.1–0.9)
Monocytes: 8 %
Neutrophils Absolute: 7.3 10*3/uL — ABNORMAL HIGH (ref 1.4–7.0)
Neutrophils: 70 %
Platelets: 207 10*3/uL (ref 150–450)
RBC: 4.73 x10E6/uL (ref 3.77–5.28)
RDW: 13.4 % (ref 11.7–15.4)
WBC: 10.3 10*3/uL (ref 3.4–10.8)

## 2021-07-17 LAB — COMPREHENSIVE METABOLIC PANEL
ALT: 12 IU/L (ref 0–32)
AST: 20 IU/L (ref 0–40)
Albumin/Globulin Ratio: 2.5 — ABNORMAL HIGH (ref 1.2–2.2)
Albumin: 4.9 g/dL — ABNORMAL HIGH (ref 3.7–4.7)
Alkaline Phosphatase: 51 IU/L (ref 44–121)
BUN/Creatinine Ratio: 27 (ref 12–28)
BUN: 34 mg/dL — ABNORMAL HIGH (ref 8–27)
Bilirubin Total: 0.5 mg/dL (ref 0.0–1.2)
CO2: 22 mmol/L (ref 20–29)
Calcium: 10.3 mg/dL (ref 8.7–10.3)
Chloride: 99 mmol/L (ref 96–106)
Creatinine, Ser: 1.24 mg/dL — ABNORMAL HIGH (ref 0.57–1.00)
Globulin, Total: 2 g/dL (ref 1.5–4.5)
Glucose: 111 mg/dL — ABNORMAL HIGH (ref 70–99)
Potassium: 5.1 mmol/L (ref 3.5–5.2)
Sodium: 137 mmol/L (ref 134–144)
Total Protein: 6.9 g/dL (ref 6.0–8.5)
eGFR: 45 mL/min/{1.73_m2} — ABNORMAL LOW (ref >59)

## 2021-07-17 LAB — LIPID PANEL
Chol/HDL Ratio: 2.1 ratio (ref 0.0–4.4)
Cholesterol, Total: 171 mg/dL (ref 100–199)
HDL: 83 mg/dL (ref >39)
LDL Chol Calc (NIH): 71 mg/dL (ref 0–99)
Triglycerides: 97 mg/dL (ref 0–149)
VLDL Cholesterol Cal: 17 mg/dL (ref 5–40)

## 2021-07-17 LAB — TSH: TSH: 1.79 u[IU]/mL (ref 0.450–4.500)

## 2021-07-31 DIAGNOSIS — I420 Dilated cardiomyopathy: Secondary | ICD-10-CM | POA: Diagnosis not present

## 2021-07-31 DIAGNOSIS — I255 Ischemic cardiomyopathy: Secondary | ICD-10-CM | POA: Diagnosis not present

## 2021-07-31 DIAGNOSIS — R002 Palpitations: Secondary | ICD-10-CM | POA: Diagnosis not present

## 2021-08-13 ENCOUNTER — Other Ambulatory Visit: Payer: Self-pay

## 2021-08-13 ENCOUNTER — Telehealth: Payer: Self-pay | Admitting: Internal Medicine

## 2021-08-13 DIAGNOSIS — K2100 Gastro-esophageal reflux disease with esophagitis, without bleeding: Secondary | ICD-10-CM

## 2021-08-13 MED ORDER — PANTOPRAZOLE SODIUM 40 MG PO TBEC: 40.0000 mg | DELAYED_RELEASE_TABLET | Freq: Two times a day (BID) | ORAL | 1 refills | Status: DC

## 2021-08-13 NOTE — Telephone Encounter (Signed)
Pt called to report that the medication is working well for her, now she needs a prescription because she is almost out.  Pantoprazole [address] Mg  [facility] [place], [place] - [address]  [address] [place] [address]  Phone: [phone] Fax: [phone]

## 2021-08-16 DIAGNOSIS — Z452 Encounter for adjustment and management of vascular access device: Secondary | ICD-10-CM | POA: Diagnosis not present

## 2021-10-30 DIAGNOSIS — R002 Palpitations: Secondary | ICD-10-CM | POA: Diagnosis not present

## 2021-10-30 DIAGNOSIS — I420 Dilated cardiomyopathy: Secondary | ICD-10-CM | POA: Diagnosis not present

## 2021-10-30 DIAGNOSIS — I255 Ischemic cardiomyopathy: Secondary | ICD-10-CM | POA: Diagnosis not present

## 2021-10-30 DIAGNOSIS — I5022 Chronic systolic (congestive) heart failure: Secondary | ICD-10-CM | POA: Diagnosis not present

## 2021-10-30 DIAGNOSIS — Z95810 Presence of automatic (implantable) cardiac defibrillator: Secondary | ICD-10-CM | POA: Diagnosis not present

## 2021-10-30 DIAGNOSIS — Z4502 Encounter for adjustment and management of automatic implantable cardiac defibrillator: Secondary | ICD-10-CM | POA: Diagnosis not present

## 2021-10-30 DIAGNOSIS — I2510 Atherosclerotic heart disease of native coronary artery without angina pectoris: Secondary | ICD-10-CM | POA: Diagnosis not present

## 2021-11-11 DIAGNOSIS — H40023 Open angle with borderline findings, high risk, bilateral: Secondary | ICD-10-CM | POA: Diagnosis not present

## 2021-12-12 DIAGNOSIS — C50412 Malignant neoplasm of upper-outer quadrant of left female breast: Secondary | ICD-10-CM | POA: Diagnosis not present

## 2021-12-12 DIAGNOSIS — Z170 Estrogen receptor positive status [ER+]: Secondary | ICD-10-CM | POA: Diagnosis not present

## 2021-12-26 DIAGNOSIS — I890 Lymphedema, not elsewhere classified: Secondary | ICD-10-CM | POA: Diagnosis not present

## 2022-02-06 DIAGNOSIS — I5022 Chronic systolic (congestive) heart failure: Secondary | ICD-10-CM | POA: Diagnosis not present

## 2022-02-06 DIAGNOSIS — Z4502 Encounter for adjustment and management of automatic implantable cardiac defibrillator: Secondary | ICD-10-CM | POA: Diagnosis not present

## 2022-02-06 DIAGNOSIS — Z95810 Presence of automatic (implantable) cardiac defibrillator: Secondary | ICD-10-CM | POA: Diagnosis not present

## 2022-02-10 ENCOUNTER — Other Ambulatory Visit: Payer: Self-pay | Admitting: Internal Medicine

## 2022-02-10 DIAGNOSIS — K2100 Gastro-esophageal reflux disease with esophagitis, without bleeding: Secondary | ICD-10-CM

## 2022-02-11 NOTE — Telephone Encounter (Signed)
Requested Prescriptions  Pending Prescriptions Disp Refills  . pantoprazole (PROTONIX) 40 MG tablet [Pharmacy Med Name: pantoprazole 40 mg tablet,delayed release] 180 tablet 2    Sig: Take 1 tablet (40 mg total) by mouth 2 (two) times daily.     Gastroenterology: Proton Pump Inhibitors Passed - 02/11/2022 11:47 AM      Passed - Valid encounter within last 12 months    Recent Outpatient Visits          7 months ago Annual physical exam   [facility] Reubin Milan, MD   1 year ago Annual physical exam   [facility] Reubin Milan, MD   2 years ago Annual physical exam   [facility] Reubin Milan, MD   3 years ago Annual physical exam   [facility] Reubin Milan, MD   4 years ago Essential (primary) hypertension   [facility] Reubin Milan, MD      Future Appointments            In 5 months Judithann Graves Nyoka Cowden, MD [facility]

## 2022-02-11 NOTE — Telephone Encounter (Signed)
Pam from the pharmacy called for status update, reports that the patient is completely out of her current supply. Please advise

## 2022-02-26 DIAGNOSIS — Z452 Encounter for adjustment and management of vascular access device: Secondary | ICD-10-CM | POA: Diagnosis not present

## 2022-04-07 DIAGNOSIS — I420 Dilated cardiomyopathy: Secondary | ICD-10-CM | POA: Diagnosis not present

## 2022-04-07 DIAGNOSIS — I5022 Chronic systolic (congestive) heart failure: Secondary | ICD-10-CM | POA: Diagnosis not present

## 2022-04-07 DIAGNOSIS — I255 Ischemic cardiomyopathy: Secondary | ICD-10-CM | POA: Diagnosis not present

## 2022-04-07 DIAGNOSIS — I2510 Atherosclerotic heart disease of native coronary artery without angina pectoris: Secondary | ICD-10-CM | POA: Diagnosis not present

## 2022-04-07 DIAGNOSIS — R002 Palpitations: Secondary | ICD-10-CM | POA: Diagnosis not present

## 2022-04-09 DIAGNOSIS — L820 Inflamed seborrheic keratosis: Secondary | ICD-10-CM | POA: Diagnosis not present

## 2022-04-09 DIAGNOSIS — D229 Melanocytic nevi, unspecified: Secondary | ICD-10-CM | POA: Diagnosis not present

## 2022-04-09 DIAGNOSIS — D492 Neoplasm of unspecified behavior of bone, soft tissue, and skin: Secondary | ICD-10-CM | POA: Diagnosis not present

## 2022-04-09 DIAGNOSIS — L299 Pruritus, unspecified: Secondary | ICD-10-CM | POA: Diagnosis not present

## 2022-04-09 DIAGNOSIS — L814 Other melanin hyperpigmentation: Secondary | ICD-10-CM | POA: Diagnosis not present

## 2022-04-09 DIAGNOSIS — D0462 Carcinoma in situ of skin of left upper limb, including shoulder: Secondary | ICD-10-CM | POA: Diagnosis not present

## 2022-05-01 DIAGNOSIS — L820 Inflamed seborrheic keratosis: Secondary | ICD-10-CM | POA: Diagnosis not present

## 2022-05-01 DIAGNOSIS — D0462 Carcinoma in situ of skin of left upper limb, including shoulder: Secondary | ICD-10-CM | POA: Diagnosis not present

## 2022-05-08 DIAGNOSIS — Z95810 Presence of automatic (implantable) cardiac defibrillator: Secondary | ICD-10-CM | POA: Diagnosis not present

## 2022-05-08 DIAGNOSIS — I5022 Chronic systolic (congestive) heart failure: Secondary | ICD-10-CM | POA: Diagnosis not present

## 2022-05-08 DIAGNOSIS — Z4502 Encounter for adjustment and management of automatic implantable cardiac defibrillator: Secondary | ICD-10-CM | POA: Diagnosis not present

## 2022-05-29 DIAGNOSIS — Z452 Encounter for adjustment and management of vascular access device: Secondary | ICD-10-CM | POA: Diagnosis not present

## 2022-06-02 DIAGNOSIS — C50419 Malignant neoplasm of upper-outer quadrant of unspecified female breast: Secondary | ICD-10-CM | POA: Diagnosis not present

## 2022-06-09 DIAGNOSIS — H52223 Regular astigmatism, bilateral: Secondary | ICD-10-CM | POA: Diagnosis not present

## 2022-06-09 DIAGNOSIS — H5212 Myopia, left eye: Secondary | ICD-10-CM | POA: Diagnosis not present

## 2022-06-09 DIAGNOSIS — H40013 Open angle with borderline findings, low risk, bilateral: Secondary | ICD-10-CM | POA: Diagnosis not present

## 2022-07-07 ENCOUNTER — Ambulatory Visit: Payer: Medicare PPO

## 2022-07-11 ENCOUNTER — Telehealth: Payer: Self-pay

## 2022-07-11 ENCOUNTER — Ambulatory Visit (INDEPENDENT_AMBULATORY_CARE_PROVIDER_SITE_OTHER): Payer: Medicare PPO

## 2022-07-11 DIAGNOSIS — Z0000 Encounter for general adult medical examination without abnormal findings: Secondary | ICD-10-CM | POA: Diagnosis not present

## 2022-07-11 NOTE — Telephone Encounter (Signed)
Patient informed.  Kristen Blanchard 

## 2022-07-11 NOTE — Progress Notes (Signed)
I connected with  [name] on 07/11/22 by a audio enabled telemedicine application and verified that I am speaking with the correct person using two identifiers.  Patient Location: Home  Provider Location: Office/Clinic  I discussed the limitations of evaluation and management by telemedicine. The patient expressed understanding and agreed to proceed.  Subjective:   Kristen Blanchard is a 81 y.o. female who presents for Medicare Annual (Subsequent) preventive examination.  Review of Systems    Per HPI unless specifically indicated below.  Cardiac Risk Factors include: advanced age (>32men, >51 women);female gender, essential hypertension, and hyperlipidemia.           Objective:       07/16/2021   10:06 AM 07/11/2020   10:15 AM 06/27/2020   10:48 AM  Vitals with BMI  Height 5\' 3"  5\' 3"  5\' 3"   Weight 152 lbs 3 oz 153 lbs 153 lbs 6 oz  BMI 26.97 27.11 27.18  Systolic 128 110  Diastolic 70 74 66  Pulse 62 60 60    Today's Vitals   07/11/22 1025  PainSc: 3    There is no height or weight on file to calculate BMI.     07/03/2021   10:50 AM 06/27/2020   10:56 AM 06/27/2019   10:54 AM 06/25/2017   10:13 AM 06/13/2016   10:52 AM  Advanced Directives  Does Patient Have a Medical Advance Directive? Yes Yes No Yes No  Type of 06/29/2019 of [place];Living will Healthcare Power of [place];Living will     Does patient want to make changes to medical advance directive?    Yes (MAU/Ambulatory/Procedural Areas - Information given)   Copy of Healthcare Power of Attorney in Chart? Yes - validated most recent copy scanned in chart (See row information) No - copy requested     Would patient like information on creating a medical advance directive?   No - Patient declined      Current Medications (verified) Outpatient Encounter Medications as of 07/11/2022  Medication Sig   acetaminophen (TYLENOL) 650 MG CR tablet Take 650 mg by mouth every  morning.   aspirin 81 MG chewable tablet Chew by mouth.   Biotin 5 MG CAPS Take by mouth.   Calcium-Vitamin D-Vitamin K 581 209 7563-40 MG-UNT-MCG CHEW Chew 1 tablet by mouth daily. 600-125   Coenzyme Q10 100 MG TABS Take by mouth.   cyanocobalamin 1000 MCG tablet Take by mouth.   Docusate Calcium (STOOL SOFTENER PO) Take 50 mg by mouth.   famotidine (PEPCID) 20 MG tablet TAKE 1 TABLET BY MOUTH TWICE A DAY   furosemide (LASIX) 40 MG tablet Take 1 tablet by mouth daily. On Mondays and Fridays   lisinopril (ZESTRIL) 10 MG tablet Take 1 tablet by mouth daily.   metoprolol tartrate (LOPRESSOR) 25 MG tablet Take 1 tablet by mouth 2 (two) times daily.   nitroGLYCERIN (NITROSTAT) 0.4 MG SL tablet Place 1 tablet under the tongue as needed.   pantoprazole (PROTONIX) 40 MG tablet Take 1 tablet (40 mg total) by mouth 2 (two) times daily.   polyethylene glycol (MIRALAX / GLYCOLAX) 17 g packet Take 17 g by mouth daily.   rosuvastatin (CRESTOR) 20 MG tablet Take 20 mg by mouth daily.   spironolactone (ALDACTONE) 25 MG tablet Take 0.5 tablets by mouth daily.   No facility-administered encounter medications on file as of 07/11/2022.    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   LAPAROSCOPIC HYSTERECTOMY  1980   partial   MASTECTOMY Left 02/2009   MASTECTOMY MODIFIED RADICAL Right 07/1986   RETINAL DETACHMENT SURGERY  07/2011   Family History  Problem Relation Age of Onset   Cancer Mother        Breast   Cancer Father        lung   Rheum arthritis Brother    Healthy Brother    Social History   Socioeconomic History   Marital status: Married    Spouse name: Not on file   Number of  children: 1   Years of education: Not on file   Highest education level: Not on file  Occupational History   Occupation: Retired  Tobacco Use   Smoking status: Former    Packs/day: 0.50    Years: 10.00    Total pack years: 5.00    Types: Cigarettes   Smokeless tobacco: Never  Vaping Use   Vaping Use: Never used  Substance and Sexual Activity   Alcohol use: No    Alcohol/week: 0.0 standard drinks of alcohol   Drug use: No   Sexual activity: Not Currently  Other Topics Concern   Not on file  Social History Narrative   Not on file   Social Determinants of Health   Financial Resource Strain: Low Risk  (07/11/2022)   Overall Financial Resource Strain (CARDIA)    Difficulty of Paying Living Expenses: Not hard at all  Food Insecurity: No Food Insecurity (07/11/2022)   Hunger Vital Sign    Worried About Running Out of Food in the Last Year: Never true    Ran Out of Food in the Last Year: Never true  Transportation Needs: No Transportation Needs (07/11/2022)   PRAPARE - Administrator, Civil Service (Medical): No    Lack of Transportation (Non-Medical): No  Physical Activity: Inactive (07/11/2022)   Exercise Vital Sign    Days of Exercise per Week: 0 days    Minutes of Exercise per Session: 0 min  Stress: No Stress Concern Present (07/11/2022)   [employer] of Occupational Health - Occupational Stress Questionnaire    Feeling of Stress : Only a little  Social Connections: Socially Integrated (07/11/2022)   Social Connection and Isolation Panel [NHANES]    Frequency of Communication with Friends and Family: Twice a week    Frequency of Social Gatherings with Friends and Family: Once a week    Attends Religious Services: More than 4 times per year    Active Member of [employer] or Organizations: Yes    Attends Engineer, structural: More than 4 times per year    Marital Status: Married    Tobacco Counseling Counseling given: Not Answered   Clinical  Intake:  Pre-visit preparation completed: No  Pain : 0-10 Pain Score: 3  Pain Type: Chronic pain Pain Location: Abdomen Pain Descriptors / Indicators: Aching, Burning Pain Onset: Today     Nutritional Status: BMI of 19-24  Normal Nutritional Risks: None Diabetes: No  How often do you need to have someone help you when you read instructions, pamphlets, or other written materials from your doctor or pharmacy?: 1 - Never  Diabetic?No  Interpreter Needed?: No  Information  entered by :: Laurel Dimmer, CMA   Activities of Daily Living    07/11/2022   10:23 AM 07/16/2021   10:15 AM  In your present state of health, do you have any difficulty performing the following activities:  Hearing? 0 0  Vision? 1 0  Comment Dr. Ezzard Standing   Difficulty concentrating or making decisions? 0 0  Walking or climbing stairs? 1 1  Dressing or bathing? 0 0  Doing errands, shopping? 1 0    Patient Care Team: Reubin Milan, MD as PCP - General (Internal Medicine) Hardie-Hood, Murvin Natal, MD as Referring Physician (Oncology) Gae Gallop, MD as Referring Physician (Cardiology)  Indicate any recent Medical Services you may have received from other than [facility] providers in the past year (date may be approximate). The pt was seen several times by [facility], [facility], and [facility].     Assessment:   This is a routine wellness examination for [place].  Hearing/Vision screen Denies any hearing issues. Wear glasses, no change to her vision. Annual Eye exam with Dr. Ezzard Standing.  Dietary issues and exercise activities discussed: Current Exercise Habits: The patient does not participate in regular exercise at present, Exercise limited by: None identified   Goals Addressed   None    Depression Screen    07/11/2022   10:20 AM 07/16/2021   10:14 AM 07/03/2021   10:50 AM 07/11/2020   10:16 AM 06/27/2020   10:54 AM 07/07/2019   10:26 AM 06/27/2019    10:56 AM  PHQ 2/9 Scores  PHQ - 2 Score 2 0 0 0 0 0 0  PHQ- 9 Score 6 4  0  0     Fall Risk    07/11/2022   10:22 AM 07/16/2021   10:15 AM 07/03/2021   10:51 AM 07/11/2020   10:16 AM 06/27/2020   10:56 AM  Fall Risk   Falls in the past year? 0 0 0 0 0  Number falls in past yr: 0 0 0  0  Injury with Fall? 0 0 0  0  Risk for fall due to : No Fall Risks No Fall Risks No Fall Risks  Impaired balance/gait  Follow up Falls evaluation completed Falls evaluation completed Falls prevention discussed Falls evaluation completed Falls prevention discussed    FALL RISK PREVENTION PERTAINING TO THE HOME:  Any stairs in or around the home? No  If so, are there any without handrails? No  Home free of loose throw rugs in walkways, pet beds, electrical cords, etc? Yes  Adequate lighting in your home to reduce risk of falls? Yes   ASSISTIVE DEVICES UTILIZED TO PREVENT FALLS:  Life alert? No  Use of a cane, walker or w/c? Yes  Grab bars in the bathroom? Yes  Shower chair or bench in shower? No  Elevated toilet seat or a handicapped toilet? Yes   TIMED UP AND GO:  Was the test performed?  virtual appt  .    Cognitive Function:        07/11/2022   10:23 AM 06/27/2019   10:57 AM 06/25/2017   10:12 AM 06/13/2016   10:53 AM  6CIT Screen  What Year? 0 points 0 points 0 points 0 points  What month? 0 points 0 points 0 points 0 points  What time? 0 points 0 points 0 points 0 points  Count back from 20 0 points 0 points 0 points 0 points  Months in reverse 0 points 0  points 0 points 0 points  Repeat phrase 0 points 0 points 0 points 0 points  Total Score 0 points 0 points 0 points 0 points    Immunizations Immunization History  Administered Date(s) Administered   PFIZER Comirnaty(Gray Top)Covid-19 Tri-Sucrose Vaccine 03/05/2021   PFIZER(Purple Top)SARS-COV-2 Vaccination 09/23/2019, 10/13/2019, 05/24/2020    TDAP status: Due, Education has been provided regarding the importance of  this vaccine. Advised may receive this vaccine at local pharmacy or Health Dept. Aware to provide a copy of the vaccination record if obtained from local pharmacy or Health Dept. Verbalized acceptance and understanding.  Flu Vaccine status: Due, Education has been provided regarding the importance of this vaccine. Advised may receive this vaccine at local pharmacy or Health Dept. Aware to provide a copy of the vaccination record if obtained from local pharmacy or Health Dept. Verbalized acceptance and understanding.  Pneumococcal vaccine status: Due, Education has been provided regarding the importance of this vaccine. Advised may receive this vaccine at local pharmacy or Health Dept. Aware to provide a copy of the vaccination record if obtained from local pharmacy or Health Dept. Verbalized acceptance and understanding.  Covid-19 vaccine status: Information provided on how to obtain vaccines.   Qualifies for Shingles Vaccine? Yes   Zostavax completed No   Shingrix Completed?: No.    Education has been provided regarding the importance of this vaccine. Patient has been advised to call insurance company to determine out of pocket expense if they have not yet received this vaccine. Advised may also receive vaccine at local pharmacy or Health Dept. Verbalized acceptance and understanding.  Screening Tests Health Maintenance  Topic Date Due   DTaP/Tdap/Td (1 - Tdap) Never done   Zoster Vaccines- Shingrix (1 of 2) Never done   Pneumonia Vaccine 61+ Years old (1 - PCV) Never done   INFLUENZA VACCINE  Never done   COVID-19 Vaccine (5 - 2023-24 season) 03/28/2022   Medicare Annual Wellness (AWV)  07/12/2023   DEXA SCAN  Completed   Hepatitis C Screening  Completed   HPV VACCINES  Aged Out   COLONOSCOPY (Pts 45-65yrs Insurance coverage will need to be confirmed)  Discontinued    Health Maintenance  Health Maintenance Due  Topic Date Due   DTaP/Tdap/Td (1 - Tdap) Never done   Zoster Vaccines-  Shingrix (1 of 2) Never done   Pneumonia Vaccine 27+ Years old (1 - PCV) Never done   INFLUENZA VACCINE  Never done   COVID-19 Vaccine (5 - 2023-24 season) 03/28/2022    Colorectal cancer screening: No longer required.   Mammogram status: No longer required due to age.  DEXA Scan: 04/08/2017  Lung Cancer Screening: (Low Dose CT Chest recommended if Age 25-80 years, 30 pack-year currently smoking OR have quit w/in 15years.) does not qualify.     Additional Screening:  Hepatitis C Screening: does qualify; Completed 07/11/20  Vision Screening: Recommended annual ophthalmology exams for early detection of glaucoma and other disorders of the eye. Is the patient up to date with their annual eye exam?  Yes  Who is the provider or what is the name of the office in which the patient attends annual eye exams? Dr. Ezzard Standing  If pt is not established with a provider, would they like to be referred to a provider to establish care? No .   Dental Screening: Recommended annual dental exams for proper oral hygiene  Community Resource Referral / Chronic Care Management: CRR required this visit?  No   CCM required  this visit?  No      Plan:     I have personally reviewed and noted the following in the patient's chart:   Medical and social history Use of alcohol, tobacco or illicit drugs  Current medications and supplements including opioid prescriptions. Patient is not currently taking opioid prescriptions. Functional ability and status Nutritional status Physical activity Advanced directives List of other physicians Hospitalizations, surgeries, and ER visits in previous 12 months Vitals Screenings to include cognitive, depression, and falls Referrals and appointments  In addition, I have reviewed and discussed with patient certain preventive protocols, quality metrics, and best practice recommendations. A written personalized care plan for preventive services as well as general  preventive health recommendations were provided to patient.    Ms. Kristen Blanchard , Thank you for taking time to come for your Medicare Wellness Visit. I appreciate your ongoing commitment to your health goals. Please review the following plan we discussed and let me know if I can assist you in the future.   These are the goals we discussed:  Goals      DIET - INCREASE WATER INTAKE     Recommend drinking 6-8 glasses of water per day      Exercise 3x per week (30 min per time)     Patient wants to find some activities outside the house.     Feel Better     Would like to feel better overall.         This is a list of the screening recommended for you and due dates:  Health Maintenance  Topic Date Due   DTaP/Tdap/Td vaccine (1 - Tdap) Never done   Zoster (Shingles) Vaccine (1 of 2) Never done   Pneumonia Vaccine (1 - PCV) Never done   Flu Shot  Never done   COVID-19 Vaccine (5 - 2023-24 season) 03/28/2022   Medicare Annual Wellness Visit  07/12/2023   DEXA scan (bone density measurement)  Completed   Hepatitis C Screening: USPSTF Recommendation to screen - Ages 6918-79 yo.  Completed   HPV Vaccine  Aged Out   Colon Cancer Screening  Discontinued     Lonna CobbKelita L Kelsy Blanchard, [facility] HospitalCMA   07/11/2022   Nurse Notes: Approximately 30-minute Non-Face -To-Face Medicare Wellness Visit. The pt complains of Intermittent constipation, lower abdominal pain, and pressure x 2 months. She reports that her Oncologist recommended that she take Miralax PRN. When she takes the Miralax she has a bowel movement, but the constipation returns when she stops. I recommended she continue the Miralax until her appt with you next Friday and increase her fluid intake.

## 2022-07-11 NOTE — Patient Instructions (Signed)

## 2022-07-11 NOTE — Telephone Encounter (Signed)
FYI:The pt had her Virtual AWV appointment today. She complained of Intermittent constipation, lower abdominal pain, and rectal pressure x 2 months. She reports that her Oncologist recommended that she take Miralax PRN. She said when she takes the Miralax she has a bowel movement, but the constipation returns when she stops. I recommended she continue the Miralax until her appt with you next Friday and increase her fluid intake. She reports mushy sticky normal size bowel movements today and yesterday with the help of the Miralax.

## 2022-07-18 ENCOUNTER — Encounter: Payer: Self-pay | Admitting: Internal Medicine

## 2022-07-18 ENCOUNTER — Ambulatory Visit (INDEPENDENT_AMBULATORY_CARE_PROVIDER_SITE_OTHER): Payer: Medicare PPO | Admitting: Internal Medicine

## 2022-07-18 VITALS — BP 128/62 | Ht 63.0 in | Wt 154.0 lb

## 2022-07-18 DIAGNOSIS — I5022 Chronic systolic (congestive) heart failure: Secondary | ICD-10-CM | POA: Diagnosis not present

## 2022-07-18 DIAGNOSIS — E785 Hyperlipidemia, unspecified: Secondary | ICD-10-CM | POA: Diagnosis not present

## 2022-07-18 DIAGNOSIS — I10 Essential (primary) hypertension: Secondary | ICD-10-CM | POA: Diagnosis not present

## 2022-07-18 DIAGNOSIS — K2100 Gastro-esophageal reflux disease with esophagitis, without bleeding: Secondary | ICD-10-CM

## 2022-07-18 DIAGNOSIS — N183 Chronic kidney disease, stage 3 unspecified: Secondary | ICD-10-CM

## 2022-07-18 DIAGNOSIS — R194 Change in bowel habit: Secondary | ICD-10-CM | POA: Diagnosis not present

## 2022-07-18 DIAGNOSIS — I255 Ischemic cardiomyopathy: Secondary | ICD-10-CM | POA: Diagnosis not present

## 2022-07-18 DIAGNOSIS — I25118 Atherosclerotic heart disease of native coronary artery with other forms of angina pectoris: Secondary | ICD-10-CM | POA: Diagnosis not present

## 2022-07-18 DIAGNOSIS — Z0000 Encounter for general adult medical examination without abnormal findings: Secondary | ICD-10-CM | POA: Diagnosis not present

## 2022-07-18 DIAGNOSIS — I420 Dilated cardiomyopathy: Secondary | ICD-10-CM

## 2022-07-18 LAB — POCT URINALYSIS DIPSTICK
Bilirubin, UA: NEGATIVE
Glucose, UA: NEGATIVE
Ketones, UA: NEGATIVE
Nitrite, UA: NEGATIVE
Protein, UA: NEGATIVE
Spec Grav, UA: 1.01 (ref 1.010–1.025)
Urobilinogen, UA: 0.2 E.U./dL
pH, UA: 7.5 (ref 5.0–8.0)

## 2022-07-18 NOTE — Assessment & Plan Note (Signed)
Stable symptoms on current medications Baseline shortness of breath unchanged

## 2022-07-18 NOTE — Assessment & Plan Note (Signed)
Only taking PPI bid -supplementing with TUMS Recommend wedge pillow for bed

## 2022-07-18 NOTE — Assessment & Plan Note (Signed)
GFR stable around 45 for several years On lisinopril, lasix and spironolactone

## 2022-07-18 NOTE — Assessment & Plan Note (Signed)
Clinically stable exam with well controlled BP. Tolerating medications without side effects at this time. Pt to continue low sodium diet; benefits of regular exercise as able discussed.

## 2022-07-18 NOTE — Assessment & Plan Note (Signed)
On appropriate statin and anti-hypertensives

## 2022-07-18 NOTE — Progress Notes (Signed)
Date:  07/18/2022   Name:  Kristen Blanchard   DOB:  1941-02-04   MRN:  [id number]   Chief Complaint: Annual Exam (Breast exam no pap ) [place] is a 81 y.o. female who presents today for her Complete Annual Exam. She feels well. She reports exercising exercise chair. She reports she is sleeping well. Breast complaints none.  Mammogram: discontinued DEXA: 2018 osteopenia Colonoscopy: 20 aged out  Health Maintenance Due  Topic Date Due   DTaP/Tdap/Td (1 - Tdap) Never done    Immunization History  Administered Date(s) Administered   PFIZER Comirnaty(Gray Top)Covid-19 Tri-Sucrose Vaccine 03/05/2021   PFIZER(Purple Top)SARS-COV-2 Vaccination 09/23/2019, 10/13/2019, 05/24/2020    Hypertension This is a chronic problem. Associated symptoms include shortness of breath (with exertion). Past treatments include ACE inhibitors, beta blockers and diuretics. Hypertensive end-organ damage includes kidney disease and CAD/MI.  Hyperlipidemia This is a chronic problem. Associated symptoms include shortness of breath (with exertion). Current antihyperlipidemic treatment includes statins.  Gastroesophageal Reflux She reports no coughing or no wheezing. This is a recurrent problem. She has tried a PPI and a histamine-2 antagonist for the symptoms. The treatment provided significant relief.  Change in bowel habits - feels constipated and has to strain but can not pass stool unless she take metamucil or uses a suppository.  No blood or mucus, no pain.  Last colonoscopy 2015 unknown provider.  Lab Results  Component Value Date   NA 137 07/16/2021   K 5.1 07/16/2021   CO2 22 07/16/2021   GLUCOSE 111 (H) 07/16/2021   BUN 34 (H) 07/16/2021   CREATININE 1.24 (H) 07/16/2021   CALCIUM 10.3 07/16/2021   EGFR 45 (L) 07/16/2021   GFRNONAA 36 (L) 07/11/2020   Lab Results  Component Value Date   CHOL 171 07/16/2021   HDL 83 07/16/2021   LDLCALC 71 07/16/2021   TRIG 97 07/16/2021    CHOLHDL 2.1 07/16/2021   Lab Results  Component Value Date   TSH 1.790 07/16/2021   No results found for: "HGBA1C" Lab Results  Component Value Date   WBC 10.3 07/16/2021   HGB 14.2 07/16/2021   HCT 42.1 07/16/2021   MCV 89 07/16/2021   PLT 207 07/16/2021   Lab Results  Component Value Date   ALT 12 07/16/2021   AST 20 07/16/2021   ALKPHOS 51 07/16/2021   BILITOT 0.5 07/16/2021   Lab Results  Component Value Date   VD25OH 40.6 07/07/2019     Review of Systems  HENT:  Negative for congestion, hearing loss, tinnitus, trouble swallowing and voice change.   Respiratory:  Positive for shortness of breath (with exertion). Negative for cough, chest tightness and wheezing.   Gastrointestinal:        Reflux at night    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   Lymphedema syndrome, postmastectomy 94/76/5465   Chronic systolic HF (heart failure) ([place]) 07/09/2016   Chronic renal insufficiency, stage 3 (moderate) (HCC) 06/13/2016   Anemia 03/20/2016   Mitral regurgitation 12/03/2015   Awareness of heartbeats 06/08/2015   Coronary artery disease with stable angina pectoris ([place]) 06/08/2015   Dyslipidemia 12/13/2014   Essential (primary) hypertension 12/13/2014   H/O adenomatous polyp of colon  12/13/2014   H/O malignant neoplasm of breast 12/13/2014   Osteoporosis without current pathological fracture 12/13/2014   Ischemic dilated cardiomyopathy ([place]) 05/17/2014   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   Personal history of skin cancer 01/22/2012    Allergies  Allergen Reactions   Atorvastatin     Other reaction(s): Other (See Comments) alopecia   Deltasone [Prednisone] Other (See Comments)     Turned red after joint injection   Simvastatin     Other reaction(s): Muscle Pain   Tape     Other reaction(s): Unknown   Codeine Nausea Only    Other reaction(s): UNKNOWN   Quinolones Rash    Past Surgical History:  Procedure Laterality Date   BASAL CELL CARCINOMA EXCISION  07/2011   CORONARY ARTERY BYPASS GRAFT  07/1979   LAPAROSCOPIC HYSTERECTOMY  1980   partial   MASTECTOMY Left 02/2009   MASTECTOMY MODIFIED RADICAL Right 07/1986   RETINAL DETACHMENT SURGERY  07/2011    Social History   Tobacco Use   Smoking status: Former    Packs/day: 0.50    Years: 10.00    Total pack years: 5.00    Types: Cigarettes   Smokeless tobacco: Never  Vaping Use   Vaping Use: Never used  Substance Use Topics   Alcohol use: No    Alcohol/week: 0.0 standard drinks of alcohol   Drug use: No     Medication list has been reviewed and updated.  Current Meds  Medication Sig   acetaminophen (TYLENOL) 650 MG CR tablet Take 650 mg by mouth every morning.   aspirin 81 MG chewable tablet Chew by mouth.   Biotin 5 MG CAPS Take by mouth.   Calcium-Vitamin D-Vitamin K [phone] MG-UNT-MCG CHEW Chew 1 tablet by mouth daily. 600-125   Coenzyme Q10 100 MG TABS Take by mouth.   cyanocobalamin 1000 MCG tablet Take by mouth.   Docusate Calcium (STOOL SOFTENER PO) Take 50 mg by mouth.   furosemide (LASIX) 40 MG tablet Take 1 tablet by mouth daily. On Mondays and Fridays   lisinopril (ZESTRIL) 10 MG tablet Take 1 tablet by mouth daily.   metoprolol tartrate (LOPRESSOR) 25 MG tablet Take 1 tablet by mouth 2 (two) times daily.   nitroGLYCERIN (NITROSTAT) 0.4 MG SL tablet Place 1 tablet under the tongue as needed.   pantoprazole (PROTONIX) 40 MG tablet Take 1 tablet (40 mg total) by mouth 2 (two) times daily.   polyethylene glycol (MIRALAX / GLYCOLAX) 17 g packet Take 17 g by mouth daily.   rosuvastatin (CRESTOR) 20 MG tablet Take 20 mg by mouth daily.   spironolactone (ALDACTONE) 25 MG tablet Take  0.5 tablets by mouth daily.       07/18/2022   10:09 AM 07/16/2021   10:14 AM 07/11/2020   10:16 AM  GAD 7 : Generalized Anxiety Score  Nervous, Anxious, on Edge 1 0 0  Control/stop worrying 1 0 0  Worry too much - different things 2 0 0  Trouble relaxing 0 0 0  Restless 0 0 0  Easily annoyed or irritable 0 0 0  Afraid - awful might happen 1 0 0  Total GAD 7 Score 5 0 0  Anxiety Difficulty Not difficult at all Not difficult at all        07/18/2022   10:09 AM 07/11/2022   10:20 AM 07/16/2021   10:14 AM  Depression screen PHQ 2/9  Decreased Interest 0 1 0  Down, Depressed, Hopeless 1 1 0  PHQ -  2 Score 1 2 0  Altered sleeping _0 Tired, decreased energy _1 Change in appetite 0 0 0  Feeling bad or failure about yourself  0 0 0  Trouble concentrating 0 0 0  Moving slowly or fidgety/restless 2 0 0  Suicidal thoughts 0 0 0  PHQ-9 Score _2 Difficult doing work/chores Not difficult at all Somewhat difficult Somewhat difficult    BP Readings from Last 3 Encounters:  07/18/22 128/62  07/16/21 128/70  07/11/20 110/74    Physical Exam Vitals and nursing note reviewed.  Constitutional:      General: She is not in acute distress.    Appearance: She is well-developed.  HENT:     Head: Normocephalic and atraumatic.     Right Ear: Tympanic membrane and ear canal normal.     Left Ear: Tympanic membrane and ear canal normal.     Nose:     Right Sinus: No maxillary sinus tenderness.     Left Sinus: No maxillary sinus tenderness.  Eyes:     General: No scleral icterus.       Right eye: No discharge.        Left eye: No discharge.     Conjunctiva/sclera: Conjunctivae normal.  Neck:     Thyroid: No thyromegaly.     Vascular: No carotid bruit.  Cardiovascular:     Rate and Rhythm: Regular rhythm.     Pulses: Normal pulses.     Heart sounds: Normal heart sounds.  Pulmonary:     Effort: Pulmonary effort is normal. No respiratory distress.     Breath sounds:  No wheezing.  Chest:  Breasts:    Right: Absent.     Left: Absent.  Abdominal:     General: Bowel sounds are normal.     Palpations: Abdomen is soft.     Tenderness: There is no abdominal tenderness. There is no guarding or rebound.  Musculoskeletal:     Cervical back: Normal range of motion. No erythema.     Right lower leg: No edema.     Left lower leg: No edema.  Lymphadenopathy:     Cervical: No cervical adenopathy.  Skin:    General: Skin is warm and dry.     Capillary Refill: Capillary refill takes less than 2 seconds.     Findings: No rash.  Neurological:     General: No focal deficit present.     Mental Status: She is alert and oriented to person, place, and time.     Cranial Nerves: No cranial nerve deficit.     Sensory: No sensory deficit.     Deep Tendon Reflexes: Reflexes are normal and symmetric.  Psychiatric:        Attention and Perception: Attention normal.        Mood and Affect: Mood normal.     Wt Readings from Last 3 Encounters:  07/18/22 154 lb (69.9 kg)  07/16/21 152 lb 3.2 oz (69 kg)  07/11/20 153 lb (69.4 kg)    BP 128/62   Ht _3  (1.6 m)   Wt 154 lb (69.9 kg)   BMI 27.28 kg/m   Assessment and Plan: Problem List Items Addressed This Visit       Cardiovascular and Mediastinum   Chronic systolic HF (heart failure) (HCC)    Stable symptoms on current medications Baseline shortness of breath unchanged      Coronary artery disease with stable angina pectoris (  [place])    On appropriate statin and anti-hypertensives      Essential (primary) hypertension (Chronic)    Clinically stable exam with well controlled BP. Tolerating medications without side effects at this time. Pt to continue low sodium diet; benefits of regular exercise as able discussed.       Relevant Orders   CBC with Differential/Platelet   Comprehensive metabolic panel   TSH   POCT urinalysis dipstick (Completed)   Ischemic dilated cardiomyopathy [facility])    Per last  cardiology note: 1. Palpitations  2. Ischemic dilated cardiomyopathy (CMS-HCC)  3. Coronary artery disease involving native coronary artery of native heart without angina pectoris  4. Cardiomyopathy, ischemic  5. Chronic systolic HF (heart failure) (CMS-HCC)         Digestive   Gastroesophageal reflux disease with esophagitis without hemorrhage (Chronic)    Only taking PPI bid -supplementing with TUMS Recommend wedge pillow for bed      Relevant Orders   CBC with Differential/Platelet     Genitourinary   Chronic renal insufficiency, stage 3 (moderate) (HCC) (Chronic)    GFR stable around 45 for several years On lisinopril, lasix and spironolactone      Relevant Orders   Comprehensive metabolic panel     Other   Dyslipidemia (Chronic)    Tolerating statin medication without side effects at this time LDL is at goal of < 70 on current dose Continue same therapy without change at this time.       Relevant Orders   Lipid panel   Other Visit Diagnoses     Annual physical exam    -  Primary   Normal exam for age up to date on screenings declines recommended vaccinations for Flu and Pneumococcus   Change in bowel habits       feels constipated but stools are soft will continue Metamucil and suppositories as needed refer to GI   Relevant Orders   Ambulatory referral to Gastroenterology        Partially dictated using Dragon software. Any errors are unintentional.  Halina Maidens, MD [facility]  07/18/2022

## 2022-07-18 NOTE — Assessment & Plan Note (Signed)
Per last cardiology note: 1. Palpitations  2. Ischemic dilated cardiomyopathy (CMS-HCC)  3. Coronary artery disease involving native coronary artery of native heart without angina pectoris  4. Cardiomyopathy, ischemic  5. Chronic systolic HF (heart failure) (CMS-HCC)

## 2022-07-18 NOTE — Assessment & Plan Note (Signed)
Tolerating statin medication without side effects at this time LDL is at goal of < 70 on current dose Continue same therapy without change at this time.  

## 2022-07-19 LAB — LIPID PANEL
Chol/HDL Ratio: 1.9 ratio (ref 0.0–4.4)
Cholesterol, Total: 162 mg/dL (ref 100–199)
HDL: 84 mg/dL (ref >39)
LDL Chol Calc (NIH): 63 mg/dL (ref 0–99)
Triglycerides: 83 mg/dL (ref 0–149)
VLDL Cholesterol Cal: 15 mg/dL (ref 5–40)

## 2022-07-19 LAB — COMPREHENSIVE METABOLIC PANEL
ALT: 9 IU/L (ref 0–32)
AST: 16 IU/L (ref 0–40)
Albumin/Globulin Ratio: 1.8 (ref 1.2–2.2)
Albumin: 4.5 g/dL (ref 3.8–4.8)
Alkaline Phosphatase: 48 IU/L (ref 44–121)
BUN/Creatinine Ratio: 20 (ref 12–28)
BUN: 30 mg/dL — ABNORMAL HIGH (ref 8–27)
Bilirubin Total: 0.7 mg/dL (ref 0.0–1.2)
CO2: 20 mmol/L (ref 20–29)
Calcium: 9.9 mg/dL (ref 8.7–10.3)
Chloride: 96 mmol/L (ref 96–106)
Creatinine, Ser: 1.49 mg/dL — ABNORMAL HIGH (ref 0.57–1.00)
Globulin, Total: 2.5 g/dL (ref 1.5–4.5)
Glucose: 114 mg/dL — ABNORMAL HIGH (ref 70–99)
Potassium: 5.2 mmol/L (ref 3.5–5.2)
Sodium: 139 mmol/L (ref 134–144)
Total Protein: 7 g/dL (ref 6.0–8.5)
eGFR: 36 mL/min/{1.73_m2} — ABNORMAL LOW (ref >59)

## 2022-07-19 LAB — CBC WITH DIFFERENTIAL/PLATELET
Basophils Absolute: 0 10*3/uL (ref 0.0–0.2)
Basos: 0 %
EOS (ABSOLUTE): 0.1 10*3/uL (ref 0.0–0.4)
Eos: 1 %
Hematocrit: 39.1 % (ref 34.0–46.6)
Hemoglobin: 13.4 g/dL (ref 11.1–15.9)
Immature Grans (Abs): 0 10*3/uL (ref 0.0–0.1)
Immature Granulocytes: 0 %
Lymphocytes Absolute: 1.8 10*3/uL (ref 0.7–3.1)
Lymphs: 20 %
MCH: 29.9 pg (ref 26.6–33.0)
MCHC: 34.3 g/dL (ref 31.5–35.7)
MCV: 87 fL (ref 79–97)
Monocytes Absolute: 0.8 10*3/uL (ref 0.1–0.9)
Monocytes: 9 %
Neutrophils Absolute: 6.2 10*3/uL (ref 1.4–7.0)
Neutrophils: 70 %
Platelets: 196 10*3/uL (ref 150–450)
RBC: 4.48 x10E6/uL (ref 3.77–5.28)
RDW: 12.6 % (ref 11.7–15.4)
WBC: 8.9 10*3/uL (ref 3.4–10.8)

## 2022-07-19 LAB — TSH: TSH: 2.65 u[IU]/mL (ref 0.450–4.500)

## 2022-07-22 ENCOUNTER — Other Ambulatory Visit: Payer: Self-pay

## 2022-07-22 DIAGNOSIS — N289 Disorder of kidney and ureter, unspecified: Secondary | ICD-10-CM

## 2022-08-04 DIAGNOSIS — L814 Other melanin hyperpigmentation: Secondary | ICD-10-CM | POA: Diagnosis not present

## 2022-08-04 DIAGNOSIS — D229 Melanocytic nevi, unspecified: Secondary | ICD-10-CM | POA: Diagnosis not present

## 2022-08-04 DIAGNOSIS — L853 Xerosis cutis: Secondary | ICD-10-CM | POA: Diagnosis not present

## 2022-08-04 DIAGNOSIS — L820 Inflamed seborrheic keratosis: Secondary | ICD-10-CM | POA: Diagnosis not present

## 2022-08-04 DIAGNOSIS — D0462 Carcinoma in situ of skin of left upper limb, including shoulder: Secondary | ICD-10-CM | POA: Diagnosis not present

## 2022-08-04 DIAGNOSIS — L821 Other seborrheic keratosis: Secondary | ICD-10-CM | POA: Diagnosis not present

## 2022-08-04 DIAGNOSIS — L578 Other skin changes due to chronic exposure to nonionizing radiation: Secondary | ICD-10-CM | POA: Diagnosis not present

## 2022-08-04 DIAGNOSIS — Z85828 Personal history of other malignant neoplasm of skin: Secondary | ICD-10-CM | POA: Diagnosis not present

## 2022-08-07 DIAGNOSIS — Z4502 Encounter for adjustment and management of automatic implantable cardiac defibrillator: Secondary | ICD-10-CM | POA: Diagnosis not present

## 2022-08-07 DIAGNOSIS — I5022 Chronic systolic (congestive) heart failure: Secondary | ICD-10-CM | POA: Diagnosis not present

## 2022-08-07 DIAGNOSIS — Z95810 Presence of automatic (implantable) cardiac defibrillator: Secondary | ICD-10-CM | POA: Diagnosis not present

## 2022-08-22 DIAGNOSIS — I5022 Chronic systolic (congestive) heart failure: Secondary | ICD-10-CM | POA: Diagnosis not present

## 2022-08-22 DIAGNOSIS — I5032 Chronic diastolic (congestive) heart failure: Secondary | ICD-10-CM | POA: Diagnosis not present

## 2022-08-22 DIAGNOSIS — K5900 Constipation, unspecified: Secondary | ICD-10-CM | POA: Diagnosis not present

## 2022-08-22 DIAGNOSIS — D126 Benign neoplasm of colon, unspecified: Secondary | ICD-10-CM | POA: Diagnosis not present

## 2022-09-09 DIAGNOSIS — D126 Benign neoplasm of colon, unspecified: Secondary | ICD-10-CM | POA: Diagnosis not present

## 2022-09-09 DIAGNOSIS — Z8719 Personal history of other diseases of the digestive system: Secondary | ICD-10-CM | POA: Diagnosis not present

## 2022-09-09 DIAGNOSIS — I5022 Chronic systolic (congestive) heart failure: Secondary | ICD-10-CM | POA: Diagnosis not present

## 2022-09-09 DIAGNOSIS — K5900 Constipation, unspecified: Secondary | ICD-10-CM | POA: Diagnosis not present

## 2022-09-10 DIAGNOSIS — Z452 Encounter for adjustment and management of vascular access device: Secondary | ICD-10-CM | POA: Diagnosis not present

## 2022-11-06 DIAGNOSIS — Z95810 Presence of automatic (implantable) cardiac defibrillator: Secondary | ICD-10-CM | POA: Diagnosis not present

## 2022-11-06 DIAGNOSIS — I5022 Chronic systolic (congestive) heart failure: Secondary | ICD-10-CM | POA: Diagnosis not present

## 2022-11-06 DIAGNOSIS — Z4502 Encounter for adjustment and management of automatic implantable cardiac defibrillator: Secondary | ICD-10-CM | POA: Diagnosis not present

## 2022-11-21 DIAGNOSIS — R1310 Dysphagia, unspecified: Secondary | ICD-10-CM | POA: Diagnosis not present

## 2022-11-21 DIAGNOSIS — K5909 Other constipation: Secondary | ICD-10-CM | POA: Diagnosis not present

## 2022-12-03 DIAGNOSIS — E785 Hyperlipidemia, unspecified: Secondary | ICD-10-CM | POA: Diagnosis not present

## 2022-12-03 DIAGNOSIS — N189 Chronic kidney disease, unspecified: Secondary | ICD-10-CM | POA: Diagnosis not present

## 2022-12-03 DIAGNOSIS — Z951 Presence of aortocoronary bypass graft: Secondary | ICD-10-CM | POA: Diagnosis not present

## 2022-12-03 DIAGNOSIS — I252 Old myocardial infarction: Secondary | ICD-10-CM | POA: Diagnosis not present

## 2022-12-03 DIAGNOSIS — K2100 Gastro-esophageal reflux disease with esophagitis, without bleeding: Secondary | ICD-10-CM | POA: Diagnosis not present

## 2022-12-03 DIAGNOSIS — R1310 Dysphagia, unspecified: Secondary | ICD-10-CM | POA: Diagnosis not present

## 2022-12-03 DIAGNOSIS — I5022 Chronic systolic (congestive) heart failure: Secondary | ICD-10-CM | POA: Diagnosis not present

## 2022-12-03 DIAGNOSIS — K449 Diaphragmatic hernia without obstruction or gangrene: Secondary | ICD-10-CM | POA: Diagnosis not present

## 2022-12-03 DIAGNOSIS — I2510 Atherosclerotic heart disease of native coronary artery without angina pectoris: Secondary | ICD-10-CM | POA: Diagnosis not present

## 2022-12-03 DIAGNOSIS — Z885 Allergy status to narcotic agent status: Secondary | ICD-10-CM | POA: Diagnosis not present

## 2022-12-09 DIAGNOSIS — N1832 Chronic kidney disease, stage 3b: Secondary | ICD-10-CM | POA: Diagnosis not present

## 2022-12-10 DIAGNOSIS — I5022 Chronic systolic (congestive) heart failure: Secondary | ICD-10-CM | POA: Diagnosis not present

## 2022-12-10 DIAGNOSIS — I420 Dilated cardiomyopathy: Secondary | ICD-10-CM | POA: Diagnosis not present

## 2022-12-10 DIAGNOSIS — I2510 Atherosclerotic heart disease of native coronary artery without angina pectoris: Secondary | ICD-10-CM | POA: Diagnosis not present

## 2022-12-10 DIAGNOSIS — I255 Ischemic cardiomyopathy: Secondary | ICD-10-CM | POA: Diagnosis not present

## 2022-12-10 DIAGNOSIS — I340 Nonrheumatic mitral (valve) insufficiency: Secondary | ICD-10-CM | POA: Diagnosis not present

## 2022-12-10 DIAGNOSIS — I25118 Atherosclerotic heart disease of native coronary artery with other forms of angina pectoris: Secondary | ICD-10-CM | POA: Diagnosis not present

## 2022-12-10 DIAGNOSIS — Z95810 Presence of automatic (implantable) cardiac defibrillator: Secondary | ICD-10-CM | POA: Diagnosis not present

## 2022-12-18 DIAGNOSIS — Z08 Encounter for follow-up examination after completed treatment for malignant neoplasm: Secondary | ICD-10-CM | POA: Diagnosis not present

## 2022-12-18 DIAGNOSIS — M8580 Other specified disorders of bone density and structure, unspecified site: Secondary | ICD-10-CM | POA: Diagnosis not present

## 2022-12-18 DIAGNOSIS — C50911 Malignant neoplasm of unspecified site of right female breast: Secondary | ICD-10-CM | POA: Diagnosis not present

## 2022-12-18 DIAGNOSIS — C50412 Malignant neoplasm of upper-outer quadrant of left female breast: Secondary | ICD-10-CM | POA: Diagnosis not present

## 2022-12-18 DIAGNOSIS — Z79899 Other long term (current) drug therapy: Secondary | ICD-10-CM | POA: Diagnosis not present

## 2022-12-18 DIAGNOSIS — Z170 Estrogen receptor positive status [ER+]: Secondary | ICD-10-CM | POA: Diagnosis not present

## 2022-12-18 DIAGNOSIS — M810 Age-related osteoporosis without current pathological fracture: Secondary | ICD-10-CM | POA: Diagnosis not present

## 2022-12-18 DIAGNOSIS — Z87891 Personal history of nicotine dependence: Secondary | ICD-10-CM | POA: Diagnosis not present

## 2022-12-18 DIAGNOSIS — Z853 Personal history of malignant neoplasm of breast: Secondary | ICD-10-CM | POA: Diagnosis not present

## 2023-01-19 ENCOUNTER — Ambulatory Visit: Payer: Medicare PPO | Admitting: Internal Medicine

## 2023-01-19 ENCOUNTER — Encounter: Payer: Self-pay | Admitting: Internal Medicine

## 2023-01-19 VITALS — BP 110/60 | HR 78 | Ht 63.0 in | Wt 148.0 lb

## 2023-01-19 DIAGNOSIS — I5022 Chronic systolic (congestive) heart failure: Secondary | ICD-10-CM

## 2023-01-19 DIAGNOSIS — N1832 Chronic kidney disease, stage 3b: Secondary | ICD-10-CM | POA: Diagnosis not present

## 2023-01-19 DIAGNOSIS — I25118 Atherosclerotic heart disease of native coronary artery with other forms of angina pectoris: Secondary | ICD-10-CM

## 2023-01-19 DIAGNOSIS — I10 Essential (primary) hypertension: Secondary | ICD-10-CM

## 2023-01-19 DIAGNOSIS — K2100 Gastro-esophageal reflux disease with esophagitis, without bleeding: Secondary | ICD-10-CM | POA: Diagnosis not present

## 2023-01-19 DIAGNOSIS — Z853 Personal history of malignant neoplasm of breast: Secondary | ICD-10-CM | POA: Diagnosis not present

## 2023-01-19 NOTE — Progress Notes (Signed)
Date:  01/19/2023   Name:  CHASITTY HEHL   DOB:  Sep 13, 1940   MRN:  [id number]   Chief Complaint: Hypertension  Hypertension This is a chronic problem. The problem is controlled. Pertinent negatives include no chest pain, headaches, palpitations or shortness of breath. Past treatments include diuretics, beta blockers and ACE inhibitors. The current treatment provides significant improvement. Hypertensive end-organ damage includes kidney disease and CAD/MI. There is no history of CVA.  Gastroesophageal Reflux She complains of dysphagia and heartburn. She reports no abdominal pain, no chest pain, no coughing, no nausea or no wheezing. This is a recurrent problem. The problem occurs occasionally. Pertinent negatives include no fatigue. Risk factors include hiatal hernia. She has tried a PPI and a histamine-2 antagonist for the symptoms. The treatment provided moderate relief.  CHF - stable with no shortness of breath or CP.  BP medications adjusted and she feels well.   RI - seen by Nephrology; advised to avoid NSAIDS.  Follow up in one year.  Renal US pending.   Lab Results  Component Value Date   NA 139 07/18/2022   K 5.2 07/18/2022   CO2 20 07/18/2022   GLUCOSE 114 (H) 07/18/2022   BUN 30 (H) 07/18/2022   CREATININE 1.49 (H) 07/18/2022   CALCIUM 9.9 07/18/2022   EGFR 36 (L) 07/18/2022   GFRNONAA 36 (L) 07/11/2020   Lab Results  Component Value Date   CHOL 162 07/18/2022   HDL 84 07/18/2022   LDLCALC 63 07/18/2022   TRIG 83 07/18/2022   CHOLHDL 1.9 07/18/2022   Lab Results  Component Value Date   TSH 2.650 07/18/2022   No results found for: "HGBA1C" Lab Results  Component Value Date   WBC 8.9 07/18/2022   HGB 13.4 07/18/2022   HCT 39.1 07/18/2022   MCV 87 07/18/2022   PLT 196 07/18/2022   Lab Results  Component Value Date   ALT 9 07/18/2022   AST 16 07/18/2022   ALKPHOS 48 07/18/2022   BILITOT 0.7 07/18/2022   Lab Results  Component Value Date   VD25OH  40.6 07/07/2019     Review of Systems  Constitutional:  Positive for unexpected weight change (has lost some weight with diet changes). Negative for chills and fatigue.  HENT:  Negative for nosebleeds.   Eyes:  Negative for visual disturbance.  Respiratory:  Negative for cough, chest tightness, shortness of breath and wheezing.   Cardiovascular:  Negative for chest pain, palpitations and leg swelling.  Gastrointestinal:  Positive for constipation (relieved by Miralax), dysphagia and heartburn. Negative for abdominal pain, diarrhea, nausea and vomiting.  Genitourinary:  Negative for dysuria.  Musculoskeletal:  Positive for arthralgias, back pain and gait problem.  Neurological:  Negative for dizziness, weakness, light-headedness and headaches.  Psychiatric/Behavioral:  Positive for dysphoric mood (husband had a stroke in February and can't remember how to do many things). Negative for sleep disturbance. The patient is not nervous/anxious.     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   Lymphedema syndrome, postmastectomy 09/19/2016   Chronic systolic HF (heart failure) (HCC) 07/09/2016   CKD stage 3b, GFR 30-44 ml/min (HCC) 06/13/2016   Anemia 03/20/2016   Mitral regurgitation 12/03/2015   Awareness  of heartbeats 06/08/2015   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) 06/08/2015   Dyslipidemia 12/13/2014   Essential (primary) hypertension 12/13/2014   H/O adenomatous polyp of colon 12/13/2014   H/O malignant neoplasm of breast 12/13/2014   Osteoporosis without current pathological fracture 12/13/2014   Ischemic dilated cardiomyopathy (HCC) 05/17/2014   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    Personal history of skin cancer 01/22/2012    Allergies  Allergen Reactions   Atorvastatin     Other reaction(s): Other (See Comments) alopecia   Deltasone [Prednisone] Other (See Comments)    Turned red after joint injection   Simvastatin     Other reaction(s): Muscle Pain   Tape     Other reaction(s): Unknown   Codeine Nausea Only    Other reaction(s): UNKNOWN   Quinolones Rash    Past Surgical History:  Procedure Laterality Date   BASAL CELL CARCINOMA EXCISION  07/2011   CORONARY ARTERY BYPASS GRAFT  07/1979   LAPAROSCOPIC HYSTERECTOMY  1980   partial   MASTECTOMY Left 02/2009   MASTECTOMY MODIFIED RADICAL Right 07/1986   RETINAL DETACHMENT SURGERY  07/2011    Social History   Tobacco Use   Smoking status: Former    Packs/day: 0.50    Years: 10.00    Additional pack years: 0.00    Total pack years: 5.00    Types: Cigarettes   Smokeless tobacco: Never  Vaping Use   Vaping Use: Never used  Substance Use Topics   Alcohol use: No    Alcohol/week: 0.0 standard drinks of alcohol   Drug use: No     Medication list has been reviewed and updated.  Current Meds  Medication Sig   acetaminophen (TYLENOL) 650 MG CR tablet Take 650 mg by mouth every morning.   aspirin 81 MG chewable tablet Chew by mouth.   Biotin 5 MG CAPS Take by mouth.   Calcium-Vitamin D-Vitamin K [phone] MG-UNT-MCG CHEW Chew 1 tablet by mouth daily. 600-125   Coenzyme Q10 100 MG TABS Take by mouth.   cyanocobalamin 1000 MCG tablet Take by mouth.   Docusate Calcium (STOOL SOFTENER PO) Take 50 mg by mouth.   furosemide (LASIX) 40 MG tablet Take 1 tablet by mouth daily. On Mondays and Fridays   lisinopril (ZESTRIL) 5 MG tablet Take 1 tablet by mouth daily.   metoprolol tartrate (LOPRESSOR) 25 MG tablet Take 1 tablet by mouth 2 (two) times daily.   nitroGLYCERIN (NITROSTAT) 0.4 MG SL tablet Place 1 tablet under the tongue as needed.   pantoprazole (PROTONIX) 40 MG tablet Take 1 tablet (40 mg  total) by mouth 2 (two) times daily.   polyethylene glycol (MIRALAX / GLYCOLAX) 17 g packet Take 17 g by mouth daily.   rosuvastatin (CRESTOR) 20 MG tablet Take 20 mg by mouth daily.   spironolactone (ALDACTONE) 25 MG tablet Take 0.5 tablets by mouth daily.       01/19/2023    9:56 AM 07/18/2022   10:09 AM 07/16/2021   10:14 AM 07/11/2020   10:16 AM  GAD 7 : Generalized Anxiety Score  Nervous, Anxious, on Edge 0 1 0 0  Control/stop worrying 0 1 0 0  Worry too much - different things 0 2 0 0  Trouble relaxing 0 0 0 0  Restless 0 0 0 0  Easily annoyed or irritable 1 0 0 0  Afraid - awful might happen 0 1 0 0  Total GAD 7 Score 1 5 0 0  Anxiety  Difficulty Not difficult at all Not difficult at all Not difficult at all        01/19/2023    9:56 AM 07/18/2022   10:09 AM 07/11/2022   10:20 AM  Depression screen PHQ 2/9  Decreased Interest 0 0 1  Down, Depressed, Hopeless 0 1 1  PHQ - 2 Score 0 1 2  Altered sleeping 2 2 1   Tired, decreased energy 3 3 3   Change in appetite 0 0 0  Feeling bad or failure about yourself  0 0 0  Trouble concentrating 0 0 0  Moving slowly or fidgety/restless 3 2 0  Suicidal thoughts 0 0 0  PHQ-9 Score 8 8 6   Difficult doing work/chores Somewhat difficult Not difficult at all Somewhat difficult    BP Readings from Last 3 Encounters:  01/19/23 110/60  07/18/22 128/62  07/16/21 128/70    Physical Exam Vitals and nursing note reviewed.  Constitutional:      General: She is not in acute distress.    Appearance: Normal appearance. She is well-developed.  HENT:     Head: Normocephalic and atraumatic.  Cardiovascular:     Rate and Rhythm: Normal rate and regular rhythm.  Pulmonary:     Effort: Pulmonary effort is normal. No respiratory distress.     Breath sounds: No wheezing or rhonchi.  Musculoskeletal:        General: Normal range of motion.     Cervical back: Normal range of motion.     Right lower leg: No edema.     Left lower leg: No  edema.  Lymphadenopathy:     Cervical: No cervical adenopathy.  Skin:    General: Skin is warm and dry.     Findings: No rash.  Neurological:     General: No focal deficit present.     Mental Status: She is alert and oriented to person, place, and time.     Gait: Gait abnormal (uses cane).  Psychiatric:        Mood and Affect: Mood normal.        Behavior: Behavior normal.     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 Recent EGD/CT colonoscopy - HH, diverticulosis and atherosclerosis. No red flag signs such as weight loss, n/v, melena       Essential (primary) hypertension - Primary (Chronic)    Stable exam with well controlled BP since reducing lisinopril to 5 mg.  Currently taking lisinopril, metoprolol, furosemide and spironolactone. Tolerating medications without concerns or side effects. Will continue to recommend low sodium diet and current regimen.       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)    No recent CP or change in shortness of breath Currents medications well tolerated. Lipids controlled.  ASA 81 mg daily.      CKD stage 3b, GFR 30-44 ml/min (HCC)  Last GFR 38 in May.  Seen by [facility] - limiting NSAIDS, etc Renal US ordered.       Chronic systolic HF (heart failure) (HCC)    Stable symptoms on current medications Followed by Cardiology       No follow-ups on file.   Partially dictated using Dragon software, any errors  are not intentional.  Reubin Milan, MD [facility] [place], [place]

## 2023-01-19 NOTE — Assessment & Plan Note (Signed)
No recent CP or change in shortness of breath Currents medications well tolerated. Lipids controlled.  ASA 81 mg daily.

## 2023-01-19 NOTE — Assessment & Plan Note (Addendum)
Stable exam with well controlled BP since reducing lisinopril to 5 mg.  Currently taking lisinopril, metoprolol, furosemide and spironolactone. Tolerating medications without concerns or side effects. Will continue to recommend low sodium diet and current regimen.

## 2023-01-19 NOTE — Assessment & Plan Note (Addendum)
Last GFR 38 in May.  Seen by [facility] Nephrology - limiting NSAIDS, etc Renal US ordered.

## 2023-01-19 NOTE — Assessment & Plan Note (Signed)
Stable symptoms on current medications Followed by Cardiology

## 2023-01-19 NOTE — Assessment & Plan Note (Addendum)
Reflux symptoms are minimal on current therapy - protonix and Pepcid. 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 Recent EGD/CT colonoscopy - HH, diverticulosis and atherosclerosis. No red flag signs such as weight loss, n/v, melena

## 2023-01-27 DIAGNOSIS — N1832 Chronic kidney disease, stage 3b: Secondary | ICD-10-CM | POA: Diagnosis not present

## 2023-01-27 DIAGNOSIS — N289 Disorder of kidney and ureter, unspecified: Secondary | ICD-10-CM | POA: Diagnosis not present

## 2023-01-27 DIAGNOSIS — K529 Noninfective gastroenteritis and colitis, unspecified: Secondary | ICD-10-CM | POA: Diagnosis not present

## 2023-01-27 DIAGNOSIS — C220 Liver cell carcinoma: Secondary | ICD-10-CM | POA: Diagnosis not present

## 2023-02-02 DIAGNOSIS — D1801 Hemangioma of skin and subcutaneous tissue: Secondary | ICD-10-CM | POA: Diagnosis not present

## 2023-02-02 DIAGNOSIS — L821 Other seborrheic keratosis: Secondary | ICD-10-CM | POA: Diagnosis not present

## 2023-02-02 DIAGNOSIS — L814 Other melanin hyperpigmentation: Secondary | ICD-10-CM | POA: Diagnosis not present

## 2023-02-02 DIAGNOSIS — L820 Inflamed seborrheic keratosis: Secondary | ICD-10-CM | POA: Diagnosis not present

## 2023-02-02 DIAGNOSIS — L570 Actinic keratosis: Secondary | ICD-10-CM | POA: Diagnosis not present

## 2023-02-02 DIAGNOSIS — Z85828 Personal history of other malignant neoplasm of skin: Secondary | ICD-10-CM | POA: Diagnosis not present

## 2023-02-05 DIAGNOSIS — R42 Dizziness and giddiness: Secondary | ICD-10-CM | POA: Diagnosis not present

## 2023-02-05 DIAGNOSIS — Z95810 Presence of automatic (implantable) cardiac defibrillator: Secondary | ICD-10-CM | POA: Diagnosis not present

## 2023-02-05 DIAGNOSIS — I255 Ischemic cardiomyopathy: Secondary | ICD-10-CM | POA: Diagnosis not present

## 2023-02-05 DIAGNOSIS — I5022 Chronic systolic (congestive) heart failure: Secondary | ICD-10-CM | POA: Diagnosis not present

## 2023-02-05 DIAGNOSIS — R002 Palpitations: Secondary | ICD-10-CM | POA: Diagnosis not present

## 2023-02-05 DIAGNOSIS — I2510 Atherosclerotic heart disease of native coronary artery without angina pectoris: Secondary | ICD-10-CM | POA: Diagnosis not present

## 2023-03-11 DIAGNOSIS — C50412 Malignant neoplasm of upper-outer quadrant of left female breast: Secondary | ICD-10-CM | POA: Diagnosis not present

## 2023-03-11 DIAGNOSIS — Z170 Estrogen receptor positive status [ER+]: Secondary | ICD-10-CM | POA: Diagnosis not present

## 2023-03-18 ENCOUNTER — Other Ambulatory Visit: Payer: Self-pay | Admitting: Internal Medicine

## 2023-03-18 DIAGNOSIS — K2100 Gastro-esophageal reflux disease with esophagitis, without bleeding: Secondary | ICD-10-CM

## 2023-04-13 ENCOUNTER — Ambulatory Visit: Payer: Self-pay | Admitting: *Deleted

## 2023-04-13 ENCOUNTER — Ambulatory Visit: Payer: Medicare PPO | Admitting: Internal Medicine

## 2023-04-13 ENCOUNTER — Encounter: Payer: Self-pay | Admitting: Internal Medicine

## 2023-04-13 VITALS — BP 104/60 | HR 59 | Temp 97.9°F | Ht 63.0 in | Wt 148.2 lb

## 2023-04-13 DIAGNOSIS — R059 Cough, unspecified: Secondary | ICD-10-CM

## 2023-04-13 DIAGNOSIS — J0100 Acute maxillary sinusitis, unspecified: Secondary | ICD-10-CM

## 2023-04-13 MED ORDER — GUAIFENESIN 100 MG/5ML PO LIQD
5.0000 mL | ORAL | Status: DC | PRN
Start: 2023-04-13 — End: 2023-04-13

## 2023-04-13 MED ORDER — AZITHROMYCIN 250 MG PO TABS
ORAL_TABLET | ORAL | 0 refills | Status: AC
Start: 2023-04-13 — End: 2023-04-18

## 2023-04-13 NOTE — Patient Instructions (Signed)
Robitussin cough syrup - plain and take as directed

## 2023-04-13 NOTE — Progress Notes (Signed)
Date:  04/13/2023   Name:  Kristen Blanchard   DOB:  1940/12/14   MRN:  [id number]   Chief Complaint: Cough (Cough, congestion, SOB at rest. X 3 weeks. Patient said she never tested herself for Covid. Has not checked temperature. )  Shortness of Breath This is a new problem. The current episode started 1 to 4 weeks ago. The problem occurs constantly. The problem has been unchanged. Associated symptoms include a sore throat, sputum production and wheezing. Pertinent negatives include no chest pain, fever or neck pain. The symptoms are aggravated by any activity.    Lab Results  Component Value Date   NA 139 07/18/2022   K 5.2 07/18/2022   CO2 20 07/18/2022   GLUCOSE 114 (H) 07/18/2022   BUN 30 (H) 07/18/2022   CREATININE 1.49 (H) 07/18/2022   CALCIUM 9.9 07/18/2022   EGFR 36 (L) 07/18/2022   GFRNONAA 36 (L) 07/11/2020   Lab Results  Component Value Date   CHOL 162 07/18/2022   HDL 84 07/18/2022   LDLCALC 63 07/18/2022   TRIG 83 07/18/2022   CHOLHDL 1.9 07/18/2022   Lab Results  Component Value Date   TSH 2.650 07/18/2022   No results found for: "HGBA1C" Lab Results  Component Value Date   WBC 8.9 07/18/2022   HGB 13.4 07/18/2022   HCT 39.1 07/18/2022   MCV 87 07/18/2022   PLT 196 07/18/2022   Lab Results  Component Value Date   ALT 9 07/18/2022   AST 16 07/18/2022   ALKPHOS 48 07/18/2022   BILITOT 0.7 07/18/2022   Lab Results  Component Value Date   VD25OH 40.6 07/07/2019     Review of Systems  Constitutional:  Positive for fatigue. Negative for chills and fever.  HENT:  Positive for sore throat.   Respiratory:  Positive for cough, sputum production, shortness of breath and wheezing. Negative for choking.   Cardiovascular:  Negative for chest pain and palpitations.  Gastrointestinal:  Negative for constipation and diarrhea.  Musculoskeletal:  Negative for neck pain.    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) 06/08/2015   Dyslipidemia 12/13/2014   Essential (primary) hypertension 12/13/2014   H/O adenomatous polyp of colon 12/13/2014   H/O malignant neoplasm of breast 12/13/2014   Osteoporosis without current pathological fracture 12/13/2014   Ischemic dilated cardiomyopathy (HCC) 05/17/2014   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   Personal history of skin cancer 01/22/2012    Allergies  Allergen Reactions   Atorvastatin     Other reaction(s): Other (See Comments) alopecia   Deltasone [Prednisone] Other (See Comments)    Turned red after joint injection   Simvastatin     Other reaction(s): Muscle Pain   Tape     Other reaction(s): Unknown   Codeine Nausea Only    Other reaction(s): UNKNOWN   Quinolones Rash    Past Surgical History:  Procedure Laterality Date   BASAL CELL CARCINOMA EXCISION  07/2011  CORONARY ARTERY BYPASS GRAFT  07/1979   LAPAROSCOPIC HYSTERECTOMY  1980   partial   MASTECTOMY Left 02/2009   MASTECTOMY MODIFIED RADICAL Right 07/1986   RETINAL DETACHMENT SURGERY  07/2011    Social History   Tobacco Use   Smoking status: Former    Current packs/day: 0.50    Average packs/day: 0.5 packs/day for 10.0 years (5.0 ttl pk-yrs)    Types: Cigarettes   Smokeless tobacco: Never  Vaping Use   Vaping status: Never Used  Substance Use Topics   Alcohol use: No    Alcohol/week: 0.0 standard drinks of alcohol   Drug use: No     Medication list has been  reviewed and updated.  Current Meds  Medication Sig   acetaminophen (TYLENOL) 650 MG CR tablet Take 650 mg by mouth every morning.   aspirin 81 MG chewable tablet Chew by mouth.   azithromycin (ZITHROMAX Z-PAK) 250 MG tablet UAD   Biotin 5 MG CAPS Take by mouth.   Calcium-Vitamin D-Vitamin K [phone] MG-UNT-MCG CHEW Chew 1 tablet by mouth daily. 600-125   Coenzyme Q10 100 MG TABS Take by mouth.   cyanocobalamin 1000 MCG tablet Take by mouth.   Docusate Calcium (STOOL SOFTENER PO) Take 50 mg by mouth.   furosemide (LASIX) 40 MG tablet Take 1 tablet by mouth daily. On Mondays and Fridays   lisinopril (ZESTRIL) 5 MG tablet Take 1 tablet by mouth daily.   metoprolol tartrate (LOPRESSOR) 25 MG tablet Take 1 tablet by mouth 2 (two) times daily.   nitroGLYCERIN (NITROSTAT) 0.4 MG SL tablet Place 1 tablet under the tongue as needed.   pantoprazole (PROTONIX) 40 MG tablet TAKE ONE TABLET BY MOUTH TWICE DAILY   polyethylene glycol (MIRALAX / GLYCOLAX) 17 g packet Take 17 g by mouth daily.   rosuvastatin (CRESTOR) 20 MG tablet Take 20 mg by mouth daily.   spironolactone (ALDACTONE) 25 MG tablet Take 0.5 tablets by mouth daily.   [DISCONTINUED] guaiFENesin (ROBITUSSIN) 100 MG/5ML liquid Take 5 mLs by mouth every 4 (four) hours as needed for cough or to loosen phlegm.       04/13/2023    1:32 PM 01/19/2023    9:56 AM 07/18/2022   10:09 AM 07/16/2021   10:14 AM  GAD 7 : Generalized Anxiety Score  Nervous, Anxious, on Edge 0 0 1 0  Control/stop worrying 0 0 1 0  Worry too much - different things 0 0 2 0  Trouble relaxing 0 0 0 0  Restless 0 0 0 0  Easily annoyed or irritable 0 1 0 0  Afraid - awful might happen 0 0 1 0  Total GAD 7 Score 0 1 5 0  Anxiety Difficulty Not difficult at all Not difficult at all Not difficult at all Not difficult at all       04/13/2023    1:32 PM 01/19/2023    9:56 AM 07/18/2022   10:09 AM  Depression screen PHQ 2/9  Decreased Interest 0 0 0  Down,  Depressed, Hopeless 0 0 1  PHQ - 2 Score 0 0 1  Altered sleeping 0 2 2  Tired, decreased energy 1 3 3   Change in appetite 0 0 0  Feeling bad or failure about yourself  0 0 0  Trouble concentrating 0 0 0  Moving slowly or fidgety/restless 0 3 2  Suicidal thoughts 0 0 0  PHQ-9 Score 1 8 8   Difficult doing work/chores Not difficult at all Somewhat difficult Not  difficult at all    BP Readings from Last 3 Encounters:  04/13/23 104/60  01/19/23 110/60  07/18/22 128/62    Physical Exam Constitutional:      Appearance: Normal appearance. She is well-developed.  HENT:     Right Ear: Ear canal and external ear normal. Tympanic membrane is not erythematous or retracted.     Left Ear: Ear canal and external ear normal. Tympanic membrane is not erythematous or retracted.     Nose:     Right Sinus: Maxillary sinus tenderness present. No frontal sinus tenderness.     Left Sinus: Maxillary sinus tenderness present. No frontal sinus tenderness.     Mouth/Throat:     Mouth: No oral lesions.     Pharynx: Uvula midline. Posterior oropharyngeal erythema present. No oropharyngeal exudate.  Cardiovascular:     Rate and Rhythm: Normal rate and regular rhythm.     Heart sounds: Normal heart sounds.  Pulmonary:     Breath sounds: Normal breath sounds. No wheezing or rales.  Musculoskeletal:     Right lower leg: No edema.  Lymphadenopathy:     Cervical: No cervical adenopathy.  Skin:    General: Skin is warm and dry.  Neurological:     Mental Status: She is alert and oriented to person, place, and time.     Wt Readings from Last 3 Encounters:  04/13/23 148 lb 3.2 oz (67.2 kg)  01/19/23 148 lb (67.1 kg)  07/18/22 154 lb (69.9 kg)    BP 104/60   Pulse (!) 59   Temp 97.9 F (36.6 C) (Oral)   Ht 5\' 3"  (1.6 m)   Wt 148 lb 3.2 oz (67.2 kg)   SpO2 99%   BMI 26.25 kg/m   Assessment and Plan:  Problem List Items Addressed This Visit   None Visit Diagnoses     Cough in adult    -   Primary   recommend Robitussin syrup for congestion rest, fluids return if needed   Acute non-recurrent maxillary sinusitis       Relevant Medications   azithromycin (ZITHROMAX Z-PAK) 250 MG tablet       No follow-ups on file.    Reubin Milan, MD [facility] [place]

## 2023-04-13 NOTE — Telephone Encounter (Signed)
Please schedule an appt for pt. Pt in the office visit notes that she declined going to the ED.  KP

## 2023-04-13 NOTE — Telephone Encounter (Signed)
  Chief Complaint: chest congestion, SOB at rest Symptoms: SOB at rest, cough, sounds like "rattling" left shoulder area in chest. Hx pacemaker and defibrillator  Frequency: 3 weeks  Pertinent Negatives: Patient denies chest pain  Disposition: [] ED /[] Urgent Care (no appt availability in office) / [] Appointment(In office/virtual)/ []  [facility]/ [] Home Care/ [x] Refused Recommended Disposition /[] [facility]/ []  Follow-up with PCP Additional Notes:    Recommended ED and patient declined. Wants to see PCP  please advise.. called [facility] to report patient refused ED.    Reason for Disposition  [1] MODERATE difficulty breathing (e.g., speaks in phrases, SOB even at rest, pulse 100-120) AND [2] NEW-onset or WORSE than normal  Answer Assessment - Initial Assessment Questions 1. RESPIRATORY STATUS: "Describe your breathing?" (e.g., wheezing, shortness of breath, unable to speak, severe coughing)      Shortness of breath coughing nonproductive left chest "rattling" 2. ONSET: "When did this breathing problem begin?"      3 weeks  3. PATTERN "Does the difficult breathing come and go, or has it been constant since it started?"      At rest constant  4. SEVERITY: "How bad is your breathing?" (e.g., mild, moderate, severe)    - MILD: No SOB at rest, mild SOB with walking, speaks normally in sentences, can lie down, no retractions, pulse < 100.    - MODERATE: SOB at rest, SOB with minimal exertion and prefers to sit, cannot lie down flat, speaks in phrases, mild retractions, audible wheezing, pulse 100-120.    - SEVERE: Very SOB at rest, speaks in single words, struggling to breathe, sitting hunched forward, retractions, pulse > 120      SOB at rest 5. RECURRENT SYMPTOM: "Have you had difficulty breathing before?" If Yes, ask: "When was the last time?" and "What happened that time?"      Yes  6. CARDIAC HISTORY: "Do you have any history of heart disease?" (e.g., heart attack,  angina, bypass surgery, angioplasty)      Yes , pacemaker, defibrillator  7. LUNG HISTORY: "Do you have any history of lung disease?"  (e.g., pulmonary embolus, asthma, emphysema)     na 8. CAUSE: "What do you think is causing the breathing problem?"      congestion 9. OTHER SYMPTOMS: "Do you have any other symptoms? (e.g., dizziness, runny nose, cough, chest pain, fever)     Cough congestion , SOB left shoulder area sounds "rattle sounds" 10. O2 SATURATION MONITOR:  "Do you use an oxygen saturation monitor (pulse oximeter) at home?" If Yes, ask: "What is your reading (oxygen level) today?" "What is your usual oxygen saturation reading?" (e.g., 95%)       unknown 11. PREGNANCY: "Is there any chance you are pregnant?" "When was your last menstrual period?"       na 12. TRAVEL: "Have you traveled out of the country in the last month?" (e.g., travel history, exposures)       na  Protocols used: Breathing Difficulty-A-AH

## 2023-05-07 DIAGNOSIS — I255 Ischemic cardiomyopathy: Secondary | ICD-10-CM | POA: Diagnosis not present

## 2023-05-07 DIAGNOSIS — R42 Dizziness and giddiness: Secondary | ICD-10-CM | POA: Diagnosis not present

## 2023-05-07 DIAGNOSIS — Z95810 Presence of automatic (implantable) cardiac defibrillator: Secondary | ICD-10-CM | POA: Diagnosis not present

## 2023-05-07 DIAGNOSIS — I2510 Atherosclerotic heart disease of native coronary artery without angina pectoris: Secondary | ICD-10-CM | POA: Diagnosis not present

## 2023-05-07 DIAGNOSIS — I5022 Chronic systolic (congestive) heart failure: Secondary | ICD-10-CM | POA: Diagnosis not present

## 2023-05-07 DIAGNOSIS — R002 Palpitations: Secondary | ICD-10-CM | POA: Diagnosis not present

## 2023-06-12 DIAGNOSIS — I5022 Chronic systolic (congestive) heart failure: Secondary | ICD-10-CM | POA: Diagnosis not present

## 2023-06-12 DIAGNOSIS — R42 Dizziness and giddiness: Secondary | ICD-10-CM | POA: Diagnosis not present

## 2023-06-12 DIAGNOSIS — R002 Palpitations: Secondary | ICD-10-CM | POA: Diagnosis not present

## 2023-06-12 DIAGNOSIS — I255 Ischemic cardiomyopathy: Secondary | ICD-10-CM | POA: Diagnosis not present

## 2023-06-12 DIAGNOSIS — Z95810 Presence of automatic (implantable) cardiac defibrillator: Secondary | ICD-10-CM | POA: Diagnosis not present

## 2023-06-12 DIAGNOSIS — Z452 Encounter for adjustment and management of vascular access device: Secondary | ICD-10-CM | POA: Diagnosis not present

## 2023-06-12 DIAGNOSIS — C50412 Malignant neoplasm of upper-outer quadrant of left female breast: Secondary | ICD-10-CM | POA: Diagnosis not present

## 2023-06-12 DIAGNOSIS — Z170 Estrogen receptor positive status [ER+]: Secondary | ICD-10-CM | POA: Diagnosis not present

## 2023-06-12 DIAGNOSIS — I340 Nonrheumatic mitral (valve) insufficiency: Secondary | ICD-10-CM | POA: Diagnosis not present

## 2023-06-12 DIAGNOSIS — I2510 Atherosclerotic heart disease of native coronary artery without angina pectoris: Secondary | ICD-10-CM | POA: Diagnosis not present

## 2023-06-12 DIAGNOSIS — C50911 Malignant neoplasm of unspecified site of right female breast: Secondary | ICD-10-CM | POA: Diagnosis not present

## 2023-06-16 DIAGNOSIS — H40013 Open angle with borderline findings, low risk, bilateral: Secondary | ICD-10-CM | POA: Diagnosis not present

## 2023-06-16 DIAGNOSIS — Z961 Presence of intraocular lens: Secondary | ICD-10-CM | POA: Diagnosis not present

## 2023-06-18 DIAGNOSIS — I2510 Atherosclerotic heart disease of native coronary artery without angina pectoris: Secondary | ICD-10-CM | POA: Diagnosis not present

## 2023-06-18 DIAGNOSIS — I5022 Chronic systolic (congestive) heart failure: Secondary | ICD-10-CM | POA: Diagnosis not present

## 2023-06-18 DIAGNOSIS — I340 Nonrheumatic mitral (valve) insufficiency: Secondary | ICD-10-CM | POA: Diagnosis not present

## 2023-06-18 DIAGNOSIS — I255 Ischemic cardiomyopathy: Secondary | ICD-10-CM | POA: Diagnosis not present

## 2023-06-24 ENCOUNTER — Encounter: Payer: Self-pay | Admitting: Internal Medicine

## 2023-07-24 ENCOUNTER — Encounter: Payer: Self-pay | Admitting: Internal Medicine

## 2023-07-24 ENCOUNTER — Ambulatory Visit (INDEPENDENT_AMBULATORY_CARE_PROVIDER_SITE_OTHER): Payer: Medicare PPO | Admitting: Internal Medicine

## 2023-07-24 VITALS — BP 124/72 | HR 59 | Ht 63.0 in | Wt 146.6 lb

## 2023-07-24 DIAGNOSIS — I255 Ischemic cardiomyopathy: Secondary | ICD-10-CM | POA: Diagnosis not present

## 2023-07-24 DIAGNOSIS — I25118 Atherosclerotic heart disease of native coronary artery with other forms of angina pectoris: Secondary | ICD-10-CM

## 2023-07-24 DIAGNOSIS — K2100 Gastro-esophageal reflux disease with esophagitis, without bleeding: Secondary | ICD-10-CM | POA: Diagnosis not present

## 2023-07-24 DIAGNOSIS — I420 Dilated cardiomyopathy: Secondary | ICD-10-CM

## 2023-07-24 DIAGNOSIS — E785 Hyperlipidemia, unspecified: Secondary | ICD-10-CM | POA: Diagnosis not present

## 2023-07-24 DIAGNOSIS — Z0000 Encounter for general adult medical examination without abnormal findings: Secondary | ICD-10-CM

## 2023-07-24 DIAGNOSIS — N1832 Chronic kidney disease, stage 3b: Secondary | ICD-10-CM | POA: Diagnosis not present

## 2023-07-24 DIAGNOSIS — I10 Essential (primary) hypertension: Secondary | ICD-10-CM | POA: Diagnosis not present

## 2023-07-24 NOTE — Patient Instructions (Signed)
Discuss the renal US from last July with the new Nephrologist at your visit scheduled in May.

## 2023-07-24 NOTE — Assessment & Plan Note (Signed)
Followed closely by Cardiology Stable symptoms per patient with no change in medicaiton

## 2023-07-24 NOTE — Assessment & Plan Note (Signed)
Reflux controlled on Protonix and PRN Pepcid

## 2023-07-24 NOTE — Assessment & Plan Note (Signed)
Followed by Cardiology ECHO  05/2023: CONCLUSION ------------------------------------------------------------------------------- SEVERE LEFT VENTRICULAR SYSTOLIC DYSFUNCTION WITH NO LVH ESTIMATED EF: 20% ELEVATED LA PRESSURES WITH DIASTOLIC DYSFUNCTION (GRADE 2) NORMAL RIGHT VENTRICULAR SYSTOLIC FUNCTION VALVULAR REGURGITATION: TRIVIAL AR, MODERATE MR, MILD PR, MILD TR NO VALVULAR STENOSIS  Compared with prior Echo study on   11/08/2019 : NO SIGNIFICANT CHANGES

## 2023-07-24 NOTE — Assessment & Plan Note (Addendum)
Stable GFR.  Will repeat today.  Discussed the renal US below - pt was not advised of the left renal mass or appropriate follow up. Since it appears benign will have her discuss with Nephrology at her visit in May.  Renal US: 01/2023 IMPRESSION:      1. 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  2. Negative for hydronephrosis.  3. Nonspecific 4 mm echogenic lesion at the lower pole of left kidney. 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 Nonshadowing stone also a  consideration. Other lesions not excluded but statistically benign. Options  for further assessment include follow-up renal ultrasound in 3 months to  document stability or CT scan although small size may limit further  characterized on CT scan.

## 2023-07-24 NOTE — Assessment & Plan Note (Signed)
Managed with diet changes only.

## 2023-07-24 NOTE — Progress Notes (Signed)
Date:  07/24/2023   Name:  Kristen Blanchard   DOB:  June 21, 1941   MRN:  [id number]   Chief Complaint: Annual Exam Kristen Blanchard is a 82 y.o. female who presents today for her Complete Annual Exam. She feels fairly well. She reports exercising nothing structured. She reports she is sleeping fairly well. Breast complaints n/a.  Her husband was just diagnosed with dementia.  Mammogram: discontinued DEXA: 2018 osteopenia Colonoscopy: 2015 discontinued  Health Maintenance Due  Topic Date Due   DTaP/Tdap/Td (1 - Tdap) Never done   Medicare Annual Wellness (AWV)  07/12/2023    Immunization History  Administered Date(s) Administered   PFIZER Comirnaty(Gray Top)Covid-19 Tri-Sucrose Vaccine 03/05/2021   PFIZER(Purple Top)SARS-COV-2 Vaccination 09/23/2019, 10/13/2019, 05/24/2020     Gastroesophageal Reflux She complains of dysphagia and heartburn. This is a recurrent problem. The problem occurs frequently. She has tried a PPI (and PRN Pepcid complete) for the symptoms.    Review of Systems  Gastrointestinal:  Positive for dysphagia and heartburn.    Lab Results  Component Value Date   NA 139 07/18/2022   K 5.2 07/18/2022   CO2 20 07/18/2022   GLUCOSE 114 (H) 07/18/2022   BUN 30 (H) 07/18/2022   CREATININE 1.49 (H) 07/18/2022   CALCIUM 9.9 07/18/2022   EGFR 36 (L) 07/18/2022   GFRNONAA 36 (L) 07/11/2020   Lab Results  Component Value Date   CHOL 162 07/18/2022   HDL 84 07/18/2022   LDLCALC 63 07/18/2022   TRIG 83 07/18/2022   CHOLHDL 1.9 07/18/2022   Lab Results  Component Value Date   TSH 2.650 07/18/2022   No results found for: "HGBA1C" Lab Results  Component Value Date   WBC 8.9 07/18/2022   HGB 13.4 07/18/2022   HCT 39.1 07/18/2022   MCV 87 07/18/2022   PLT 196 07/18/2022   Lab Results  Component Value Date   ALT 9 07/18/2022   AST 16 07/18/2022   ALKPHOS 48 07/18/2022   BILITOT 0.7 07/18/2022   Lab Results  Component Value Date   VD25OH 40.6  07/07/2019     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   Lymphedema syndrome, postmastectomy 09/19/2016   Chronic systolic HF (heart failure) (HCC) 07/09/2016   CKD stage 3b, GFR 30-44 ml/min (HCC) 06/13/2016   Anemia 03/20/2016   Mitral regurgitation 12/03/2015   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) 06/08/2015   Dyslipidemia 12/13/2014   Essential (primary) hypertension 12/13/2014   H/O adenomatous polyp of colon 12/13/2014   H/O malignant neoplasm of breast 12/13/2014   Osteoporosis without current pathological fracture 12/13/2014   Ischemic dilated cardiomyopathy (HCC) 05/17/2014   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   Personal history of skin cancer 01/22/2012    Allergies  Allergen Reactions   Atorvastatin     Other reaction(s): Other (See Comments) alopecia   Deltasone [Prednisone] Other (See Comments)    Turned red after joint injection   Simvastatin     Other reaction(s): Muscle Pain   Tape     Other reaction(s): Unknown   Codeine Nausea Only    Other reaction(s): UNKNOWN   Quinolones Rash    Past Surgical History:  Procedure Laterality Date  BASAL CELL CARCINOMA EXCISION  07/2011   CORONARY ARTERY BYPASS GRAFT  07/1979   LAPAROSCOPIC HYSTERECTOMY  1980   partial   MASTECTOMY Left 02/2009   MASTECTOMY MODIFIED RADICAL Right 07/1986   RETINAL DETACHMENT SURGERY  07/2011    Social History   Tobacco Use   Smoking status: Former    Current packs/day: 0.50    Average packs/day: 0.5 packs/day for 10.0 years (5.0 ttl pk-yrs)    Types: Cigarettes   Smokeless tobacco: Never  Vaping Use   Vaping status: Never Used  Substance Use Topics   Alcohol use: No     Alcohol/week: 0.0 standard drinks of alcohol   Drug use: No     Medication list has been reviewed and updated.  Current Meds  Medication Sig   acetaminophen (TYLENOL) 650 MG CR tablet Take 650 mg by mouth every morning.   aspirin 81 MG chewable tablet Chew by mouth.   Biotin 5 MG CAPS Take by mouth.   Calcium-Vitamin D-Vitamin K [phone] MG-UNT-MCG CHEW Chew 1 tablet by mouth daily. 600-125   Coenzyme Q10 100 MG TABS Take by mouth.   cyanocobalamin 1000 MCG tablet Take by mouth.   Docusate Calcium (STOOL SOFTENER PO) Take 50 mg by mouth.   furosemide (LASIX) 40 MG tablet Take 1 tablet by mouth daily. On Mondays and Fridays   lisinopril (ZESTRIL) 5 MG tablet Take 1 tablet by mouth daily.   metoprolol tartrate (LOPRESSOR) 25 MG tablet Take 1 tablet by mouth 2 (two) times daily.   nitroGLYCERIN (NITROSTAT) 0.4 MG SL tablet Place 1 tablet under the tongue as needed.   pantoprazole (PROTONIX) 40 MG tablet TAKE ONE TABLET BY MOUTH TWICE DAILY   polyethylene glycol (MIRALAX / GLYCOLAX) 17 g packet Take 17 g by mouth daily.   rosuvastatin (CRESTOR) 20 MG tablet Take 20 mg by mouth daily.   spironolactone (ALDACTONE) 25 MG tablet Take 0.5 tablets by mouth daily.       07/24/2023    9:53 AM 04/13/2023    1:32 PM 01/19/2023    9:56 AM 07/18/2022   10:09 AM  GAD 7 : Generalized Anxiety Score  Nervous, Anxious, on Edge 0 0 0 1  Control/stop worrying 0 0 0 1  Worry too much - different things 0 0 0 2  Trouble relaxing 0 0 0 0  Restless 0 0 0 0  Easily annoyed or irritable 0 0 1 0  Afraid - awful might happen 0 0 0 1  Total GAD 7 Score 0 0 1 5  Anxiety Difficulty Not difficult at all Not difficult at all Not difficult at all Not difficult at all       07/24/2023    9:52 AM 04/13/2023    1:32 PM 01/19/2023    9:56 AM  Depression screen PHQ 2/9  Decreased Interest 1 0 0  Down, Depressed, Hopeless 1 0 0  PHQ - 2 Score 2 0 0  Altered sleeping 1 0 2  Tired, decreased energy 1 1 3    Change in appetite 0 0 0  Feeling bad or failure about yourself  0 0 0  Trouble concentrating 0 0 0  Moving slowly or fidgety/restless 0 0 3  Suicidal thoughts 0 0 0  PHQ-9 Score 4 1 8   Difficult doing work/chores Not difficult at all Not difficult at all Somewhat difficult    BP Readings from Last 3 Encounters:  07/24/23 124/72  04/13/23 104/60  01/19/23 110/60  Physical Exam Vitals and nursing note reviewed.  Constitutional:      General: She is not in acute distress.    Appearance: She is well-developed.  HENT:     Head: Normocephalic and atraumatic.     Right Ear: Tympanic membrane and ear canal normal.     Left Ear: Tympanic membrane and ear canal normal.     Nose:     Right Sinus: No maxillary sinus tenderness.     Left Sinus: No maxillary sinus tenderness.  Eyes:     General: No scleral icterus.       Right eye: No discharge.        Left eye: No discharge.     Conjunctiva/sclera: Conjunctivae normal.  Neck:     Thyroid: No thyromegaly.     Vascular: No carotid bruit.  Cardiovascular:     Rate and Rhythm: Normal rate and regular rhythm.     Pulses: Normal pulses.     Heart sounds: Normal heart sounds.  Pulmonary:     Effort: Pulmonary effort is normal. No respiratory distress.     Breath sounds: No wheezing.  Chest:     Comments: Bilateral mastectomies with reconstruction on right Abdominal:     General: Bowel sounds are normal.     Palpations: Abdomen is soft.     Tenderness: There is no abdominal tenderness.  Musculoskeletal:     Cervical back: Normal range of motion. No erythema.     Right lower leg: No edema.     Left lower leg: No edema.  Lymphadenopathy:     Cervical: No cervical adenopathy.  Skin:    General: Skin is warm and dry.     Findings: No rash.  Neurological:     Mental Status: She is alert and oriented to person, place, and time.     Cranial Nerves: No cranial nerve deficit.     Sensory: No sensory deficit.     Deep Tendon  Reflexes: Reflexes are normal and symmetric.  Psychiatric:        Attention and Perception: Attention normal.        Mood and Affect: Mood normal.     Wt Readings from Last 3 Encounters:  07/24/23 146 lb 9.6 oz (66.5 kg)  04/13/23 148 lb 3.2 oz (67.2 kg)  01/19/23 148 lb (67.1 kg)    BP 124/72   Pulse (!) 59   Ht 5\' 3"  (1.6 m)   Wt 146 lb 9.6 oz (66.5 kg)   SpO2 96%   BMI 25.97 kg/m   Assessment and Plan:  Problem List Items Addressed This Visit       Unprioritized   Dyslipidemia (Chronic)   Managed with diet changes only.      Relevant Orders   Lipid panel   Essential (primary) hypertension (Chronic)   Controlled BP with normal exam. Current regimen is lasix, spironolactone, metoprolol and lisinopril. Will continue same medications; encourage continued reduced sodium diet.       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  Will repeat today.  Discussed the renal US below - pt was not advised of the left renal mass or appropriate follow up. Since it appears benign will have her discuss with  Nephrology at her visit in May.  Renal US: 01/2023 IMPRESSION:      1. 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  2. Negative for hydronephrosis.  3. Nonspecific 4 mm echogenic lesion at the lower pole of left kidney. 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 Nonshadowing stone also a  consideration. Other lesions not excluded but statistically benign. Options  for further assessment include follow-up renal ultrasound in 3 months to  document stability or CT scan although small size may limit further  characterized on CT scan.       Relevant Orders    Comprehensive metabolic panel   Urinalysis, Routine w reflex microscopic   Ischemic dilated cardiomyopathy (HCC)   Followed by Cardiology ECHO  05/2023: CONCLUSION ------------------------------------------------------------------------------- SEVERE LEFT VENTRICULAR SYSTOLIC DYSFUNCTION WITH NO LVH ESTIMATED EF: 20% ELEVATED LA PRESSURES WITH DIASTOLIC DYSFUNCTION (GRADE 2) NORMAL RIGHT VENTRICULAR SYSTOLIC FUNCTION VALVULAR REGURGITATION: TRIVIAL AR, MODERATE MR, MILD PR, MILD TR NO VALVULAR STENOSIS  Compared with prior Echo study on   11/08/2019 : NO SIGNIFICANT CHANGES        Gastroesophageal reflux disease with esophagitis without hemorrhage (Chronic)   Reflux controlled on Protonix and PRN Pepcid      Relevant Orders   CBC with Differential/Platelet   Other Visit Diagnoses       Annual physical exam    -  Primary   aged out of colonoscopy; mammograms declines all immunizations       Return in about 1 year (around 07/23/2024) for CPX.    Reubin Milan, MD [facility] [place]

## 2023-07-24 NOTE — Assessment & Plan Note (Signed)
Controlled BP with normal exam. Current regimen is lasix, spironolactone, metoprolol and lisinopril. Will continue same medications; encourage continued reduced sodium diet.

## 2023-07-25 LAB — COMPREHENSIVE METABOLIC PANEL
ALT: 17 [IU]/L (ref 0–32)
AST: 26 [IU]/L (ref 0–40)
Albumin: 4.7 g/dL (ref 3.8–4.8)
Alkaline Phosphatase: 46 [IU]/L (ref 44–121)
BUN/Creatinine Ratio: 18 (ref 12–28)
BUN: 23 mg/dL (ref 8–27)
Bilirubin Total: 0.7 mg/dL (ref 0.0–1.2)
CO2: 26 mmol/L (ref 20–29)
Calcium: 11.1 mg/dL — ABNORMAL HIGH (ref 8.7–10.3)
Chloride: 98 mmol/L (ref 96–106)
Creatinine, Ser: 1.25 mg/dL — ABNORMAL HIGH (ref 0.57–1.00)
Globulin, Total: 2.6 g/dL (ref 1.5–4.5)
Glucose: 96 mg/dL (ref 70–99)
Potassium: 4.9 mmol/L (ref 3.5–5.2)
Sodium: 140 mmol/L (ref 134–144)
Total Protein: 7.3 g/dL (ref 6.0–8.5)
eGFR: 44 mL/min/{1.73_m2} — ABNORMAL LOW (ref >59)

## 2023-07-25 LAB — TSH: TSH: 2.07 u[IU]/mL (ref 0.450–4.500)

## 2023-07-25 LAB — CBC WITH DIFFERENTIAL/PLATELET
Basophils Absolute: 0 10*3/uL (ref 0.0–0.2)
Basos: 0 %
EOS (ABSOLUTE): 0 10*3/uL (ref 0.0–0.4)
Eos: 0 %
Hematocrit: 43.8 % (ref 34.0–46.6)
Hemoglobin: 14.6 g/dL (ref 11.1–15.9)
Immature Grans (Abs): 0 10*3/uL (ref 0.0–0.1)
Immature Granulocytes: 0 %
Lymphocytes Absolute: 1.7 10*3/uL (ref 0.7–3.1)
Lymphs: 22 %
MCH: 29.5 pg (ref 26.6–33.0)
MCHC: 33.3 g/dL (ref 31.5–35.7)
MCV: 89 fL (ref 79–97)
Monocytes Absolute: 0.6 10*3/uL (ref 0.1–0.9)
Monocytes: 8 %
Neutrophils Absolute: 5.4 10*3/uL (ref 1.4–7.0)
Neutrophils: 70 %
Platelets: 185 10*3/uL (ref 150–450)
RBC: 4.95 x10E6/uL (ref 3.77–5.28)
RDW: 13.1 % (ref 11.7–15.4)
WBC: 7.7 10*3/uL (ref 3.4–10.8)

## 2023-07-25 LAB — LIPID PANEL
Chol/HDL Ratio: 2 {ratio} (ref 0.0–4.4)
Cholesterol, Total: 178 mg/dL (ref 100–199)
HDL: 90 mg/dL (ref >39)
LDL Chol Calc (NIH): 70 mg/dL (ref 0–99)
Triglycerides: 106 mg/dL (ref 0–149)
VLDL Cholesterol Cal: 18 mg/dL (ref 5–40)

## 2023-07-25 LAB — MICROSCOPIC EXAMINATION
Bacteria, UA: NONE SEEN
Casts: NONE SEEN /[LPF]
Epithelial Cells (non renal): NONE SEEN /[HPF] (ref 0–10)
RBC, Urine: NONE SEEN /[HPF] (ref 0–2)
WBC, UA: NONE SEEN /[HPF] (ref 0–5)

## 2023-07-25 LAB — URINALYSIS, ROUTINE W REFLEX MICROSCOPIC
Bilirubin, UA: NEGATIVE
Glucose, UA: NEGATIVE
Ketones, UA: NEGATIVE
Nitrite, UA: NEGATIVE
Protein,UA: NEGATIVE
RBC, UA: NEGATIVE
Specific Gravity, UA: 1.007 (ref 1.005–1.030)
Urobilinogen, Ur: 0.2 mg/dL (ref 0.2–1.0)
pH, UA: 6.5 (ref 5.0–7.5)

## 2023-07-27 ENCOUNTER — Other Ambulatory Visit: Payer: Self-pay | Admitting: Internal Medicine

## 2023-07-27 NOTE — Progress Notes (Signed)
Can you put this lab order in? I spoke with patient and she will go back to lab to have this draw. Thanks JM

## 2023-08-01 LAB — PTH, INTACT AND CALCIUM
Calcium: 9.9 mg/dL (ref 8.7–10.3)
PTH: 41 pg/mL (ref 15–65)

## 2023-08-05 ENCOUNTER — Ambulatory Visit (INDEPENDENT_AMBULATORY_CARE_PROVIDER_SITE_OTHER): Payer: Medicare PPO

## 2023-08-05 DIAGNOSIS — Z0000 Encounter for general adult medical examination without abnormal findings: Secondary | ICD-10-CM | POA: Diagnosis not present

## 2023-08-05 NOTE — Patient Instructions (Addendum)
 Ms. Fine , Thank you for taking time to come for your Medicare Wellness Visit. I appreciate your ongoing commitment to your health goals. Please review the following plan we discussed and let me know if I can assist you in the future.   Referrals/Orders/Follow-Ups/Clinician Recommendations: NONE  This is a list of the screening recommended for you and due dates:  Health Maintenance  Topic Date Due   DTaP/Tdap/Td vaccine (1 - Tdap) Never done   COVID-19 Vaccine (5 - 2024-25 season) 08/09/2023*   Zoster (Shingles) Vaccine (1 of 2) 10/22/2023*   Flu Shot  10/26/2023*   Pneumonia Vaccine (1 of 2 - PCV) 07/23/2024*   Medicare Annual Wellness Visit  08/04/2024   DEXA scan (bone density measurement)  Completed   HPV Vaccine  Aged Out   Colon Cancer Screening  Discontinued   Hepatitis C Screening  Discontinued  *Topic was postponed. The date shown is not the original due date.    Advanced directives: (ACP Link)Information on Advanced Care Planning can be found at [place]  Secretary of [facility] Advance Health Care Directives Advance Health Care Directives ([email or web address]   Next Medicare Annual Wellness Visit scheduled for next year: Yes   08/10/24 @ 11:30 AM IN PERSON

## 2023-08-05 NOTE — Progress Notes (Signed)
 Subjective:   Kristen Blanchard is a 83 y.o. female who presents for Medicare Annual (Subsequent) preventive examination.  Visit Complete: Virtual I connected with  Tilton VEAR Greet on 08/05/23 by a audio enabled telemedicine application and verified that I am speaking with the correct person using two identifiers.  This patient declined Interactive audio and acupuncturist. Therefore the visit was completed with audio only.   Patient Location: Home  Provider Location: Office/Clinic  I discussed the limitations of evaluation and management by telemedicine. The patient expressed understanding and agreed to proceed.  Vital Signs: Because this visit was a virtual/telehealth visit, some criteria may be missing or patient reported. Any vitals not documented were not able to be obtained and vitals that have been documented are patient reported.  Cardiac Risk Factors include: advanced age (>28men, >81 women);dyslipidemia;hypertension;sedentary lifestyle     Objective:    There were no vitals filed for this visit. There is no height or weight on file to calculate BMI.     08/05/2023   11:41 AM 07/11/2022   10:51 AM 07/03/2021   10:50 AM 06/27/2020   10:56 AM 06/27/2019   10:54 AM 06/25/2017   10:13 AM 06/13/2016   10:52 AM  Advanced Directives  Does Patient Have a Medical Advance Directive? Yes No Yes Yes No Yes No  Type of Estate Agent of [place];Living will  Healthcare Power of [place];Living will Healthcare Power of [place];Living will     Does patient want to make changes to medical advance directive? No - Patient declined     Yes (MAU/Ambulatory/Procedural Areas - Information given)   Copy of Healthcare Power of Attorney in Chart? Yes - validated most recent copy scanned in chart (See row information)  Yes - validated most recent copy scanned in chart (See row information) No - copy requested     Would patient like information on creating a  medical advance directive?  No - Patient declined   No - Patient declined      Current Medications (verified) Outpatient Encounter Medications as of 08/05/2023  Medication Sig   acetaminophen (TYLENOL) 650 MG CR tablet Take 650 mg by mouth every morning.   aspirin 81 MG chewable tablet Chew by mouth.   Biotin 5 MG CAPS Take by mouth.   Coenzyme Q10 100 MG TABS Take by mouth.   cyanocobalamin 1000 MCG tablet Take by mouth.   Docusate Calcium (STOOL SOFTENER PO) Take 50 mg by mouth.   furosemide (LASIX) 40 MG tablet Take 1 tablet by mouth daily. On Mondays and Fridays   lisinopril (ZESTRIL) 5 MG tablet Take 1 tablet by mouth daily.   metoprolol tartrate (LOPRESSOR) 25 MG tablet Take 1 tablet by mouth 2 (two) times daily.   nitroGLYCERIN (NITROSTAT) 0.4 MG SL tablet Place 1 tablet under the tongue as needed.   pantoprazole  (PROTONIX ) 40 MG tablet TAKE ONE TABLET BY MOUTH TWICE DAILY   polyethylene glycol (MIRALAX / GLYCOLAX) 17 g packet Take 17 g by mouth daily.   rosuvastatin (CRESTOR) 20 MG tablet Take 20 mg by mouth daily.   spironolactone (ALDACTONE) 25 MG tablet Take 0.5 tablets by mouth daily.   Calcium-Vitamin D -Vitamin K [id number] MG-UNT-MCG CHEW Chew 1 tablet by mouth daily. [phone] (Patient not taking: Reported on 08/05/2023)   No facility-administered encounter medications on file as of 08/05/2023.    Allergies (verified) Atorvastatin, Deltasone [prednisone], Simvastatin, Tape, Codeine, and Quinolones   History: Past Medical History:  Diagnosis Date  Awareness of heartbeats 06/08/2015   Overview:   quinidine Rx since 1982. 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   LAPAROSCOPIC HYSTERECTOMY  1980   partial   MASTECTOMY Left 02/2009   MASTECTOMY MODIFIED RADICAL Right 07/1986    RETINAL DETACHMENT SURGERY  07/2011   Family History  Problem Relation Age of Onset   Cancer Mother        Breast   Cancer Father        lung   Rheum arthritis Brother    Healthy Brother    Social History   Socioeconomic History   Marital status: Married    Spouse name: Not on file   Number of children: 1   Years of education: Not on file   Highest education level: Not on file  Occupational History   Occupation: Retired  Tobacco Use   Smoking status: Former    Current packs/day: 0.50    Average packs/day: 0.5 packs/day for 10.0 years (5.0 ttl pk-yrs)    Types: Cigarettes   Smokeless tobacco: Never  Vaping Use   Vaping status: Never Used  Substance and Sexual Activity   Alcohol use: No    Alcohol/week: 0.0 standard drinks of alcohol   Drug use: No   Sexual activity: Not Currently  Other Topics Concern   Not on file  Social History Narrative   Not on file   Social Drivers of Health   Financial Resource Strain: Low Risk  (08/05/2023)   Overall Financial Resource Strain (CARDIA)    Difficulty of Paying Living Expenses: Not hard at all  Food Insecurity: No Food Insecurity (08/05/2023)   Hunger Vital Sign    Worried About Running Out of Food in the Last Year: Never true    Ran Out of Food in the Last Year: Never true  Transportation Needs: No Transportation Needs (08/05/2023)   PRAPARE - Administrator, Civil Service (Medical): No    Lack of Transportation (Non-Medical): No  Physical Activity: Insufficiently Active (08/05/2023)   Exercise Vital Sign    Days of Exercise per Week: 3 days    Minutes of Exercise per Session: 20 min  Stress: No Stress Concern Present (08/05/2023)   [employer] of Occupational Health - Occupational Stress Questionnaire    Feeling of Stress : Only a little  Social Connections: Moderately Integrated (08/05/2023)   Social Connection and Isolation Panel [NHANES]    Frequency of Communication with Friends and Family: More than  three times a week    Frequency of Social Gatherings with Friends and Family: Once a week    Attends Religious Services: More than 4 times per year    Active Member of [employer] or Organizations: No    Attends Engineer, Structural: Never    Marital Status: Married    Tobacco Counseling Counseling given: Not Answered   Clinical Intake:  Pre-visit preparation completed: Yes  Pain : No/denies pain     BMI - recorded: 25.9 Nutritional Status: BMI 25 -29 Overweight Nutritional Risks: None Diabetes: No  How often do you need to have someone help you when you read instructions, pamphlets, or other written materials from your doctor or pharmacy?: 1 - Never  Interpreter Needed?: No  Information entered by :: Kristen Blanchard, Kristen Blanchard   Activities of Daily Living    08/05/2023   11:42 AM  In your present state of health, do you have any difficulty performing the following activities:  Hearing? 0  Vision? 0  Difficulty concentrating or making decisions? 0  Walking or climbing stairs? 1  Dressing or bathing? 0  Doing errands, shopping? 0  Preparing Food and eating ? N  Using the Toilet? N  In the past six months, have you accidently leaked urine? N  Do you have problems with loss of bowel control? N  Managing your Medications? N  Managing your Finances? N  Housekeeping or managing your Housekeeping? N    Patient Care Team: Justus Leita DEL, MD as PCP - General (Internal Medicine) Hardie-Hood, Grayce CROME, MD as Referring Physician (Oncology) Emeline Morene Rush, MD as Referring Physician (Cardiology) Hollice Rush GAILS, MD as Referring Physician (Nephrology)  Indicate any recent Medical Services you may have received from other than [facility] providers in the past year (date may be approximate).     Assessment:   This is a routine wellness examination for [place].  Hearing/Vision screen Hearing Screening - Comments:: NO AIDS Vision Screening - Comments:: WEARS GLASSES FOR  READING- DR.NEWMAN IN [place]    Goals Addressed             This Visit's Progress    DIET - EAT MORE FRUITS AND VEGETABLES         Depression Screen    08/05/2023   11:38 AM 07/24/2023    9:52 AM 04/13/2023    1:32 PM 01/19/2023    9:56 AM 07/18/2022   10:09 AM 07/11/2022   10:20 AM 07/16/2021   10:14 AM  PHQ 2/9 Scores  PHQ - 2 Score 2 2 0 0 1 2 0  PHQ- 9 Score 4 4 1 8 8 6 4     Fall Risk    08/05/2023   11:41 AM 07/24/2023    9:52 AM 04/13/2023    1:32 PM 01/19/2023    9:56 AM 07/18/2022   10:09 AM  Fall Risk   Falls in the past year? 0 0 0 0 0  Number falls in past yr: 0 0 0 0 0  Injury with Fall? 0 0 0 0 0  Risk for fall due to : No Fall Risks No Fall Risks No Fall Risks No Fall Risks No Fall Risks  Follow up Falls prevention discussed;Falls evaluation completed Falls evaluation completed Falls evaluation completed Falls evaluation completed Falls evaluation completed    MEDICARE RISK AT HOME: Medicare Risk at Home Any stairs in or around the home?: Yes If so, are there any without handrails?: No Home free of loose throw rugs in walkways, pet beds, electrical cords, etc?: Yes Adequate lighting in your home to reduce risk of falls?: Yes Life alert?: No Use of a cane, walker or w/c?: Yes (CANE ALL THE TIME) Grab bars in the bathroom?: Yes Shower chair or bench in shower?: No Elevated toilet seat or a handicapped toilet?: Yes  TIMED UP AND GO:  Was the test performed?  No    Cognitive Function:        08/05/2023   11:43 AM 07/11/2022   10:23 AM 06/27/2019   10:57 AM 06/25/2017   10:12 AM 06/13/2016   10:53 AM  6CIT Screen  What Year? 0 points 0 points 0 points 0 points 0 points  What month? 0 points 0 points 0 points  0 points 0 points  What time? 0 points 0 points 0 points 0 points 0 points  Count back from 20 0 points 0 points 0 points 0 points 0 points  Months in reverse 0 points 0 points 0 points 0 points 0 points  Repeat phrase 0 points 0 points 0  points 0 points 0 points  Total Score 0 points 0 points 0 points 0 points 0 points    Immunizations Immunization History  Administered Date(s) Administered   PFIZER Comirnaty(Gray Top)Covid-19 Tri-Sucrose Vaccine 03/05/2021   PFIZER(Purple Top)SARS-COV-2 Vaccination 09/23/2019, 10/13/2019, 05/24/2020    TDAP status: Due, Education has been provided regarding the importance of this vaccine. Advised may receive this vaccine at local pharmacy or Health Dept. Aware to provide a copy of the vaccination record if obtained from local pharmacy or Health Dept. Verbalized acceptance and understanding.  Flu Vaccine status: Declined, Education has been provided regarding the importance of this vaccine but patient still declined. Advised may receive this vaccine at local pharmacy or Health Dept. Aware to provide a copy of the vaccination record if obtained from local pharmacy or Health Dept. Verbalized acceptance and understanding.  Pneumococcal vaccine status: Declined,  Education has been provided regarding the importance of this vaccine but patient still declined. Advised may receive this vaccine at local pharmacy or Health Dept. Aware to provide a copy of the vaccination record if obtained from local pharmacy or Health Dept. Verbalized acceptance and understanding.   Covid-19 vaccine status: Completed vaccines  Qualifies for Shingles Vaccine? Yes   Zostavax completed No   Shingrix Completed?: No.    Education has been provided regarding the importance of this vaccine. Patient has been advised to call insurance company to determine out of pocket expense if they have not yet received this vaccine. Advised may also receive vaccine at local pharmacy or Health Dept. Verbalized acceptance and understanding.  Screening Tests Health Maintenance  Topic Date Due   DTaP/Tdap/Td (1 - Tdap) Never done   COVID-19 Vaccine (5 - 2024-25 season) 08/09/2023 (Originally 03/29/2023)   Zoster Vaccines- Shingrix (1 of 2)  10/22/2023 (Originally 11/23/1959)   INFLUENZA VACCINE  10/26/2023 (Originally 02/26/2023)   Pneumonia Vaccine 64+ Years old (1 of 2 - PCV) 07/23/2024 (Originally 11/23/1946)   Medicare Annual Wellness (AWV)  08/04/2024   DEXA SCAN  Completed   HPV VACCINES  Aged Out   Colonoscopy  Discontinued   Hepatitis C Screening  Discontinued    Health Maintenance  Health Maintenance Due  Topic Date Due   DTaP/Tdap/Td (1 - Tdap) Never done    Colorectal cancer screening: No longer required.   Mammogram status: No longer required due to AGE.  Bone Density status: Completed 04/08/17. Results reflect: Bone density results: OSTEOPENIA. Repeat every 5 years.- DECLINED REFERRAL  Lung Cancer Screening: (Low Dose CT Chest recommended if Age 32-80 years, 20 pack-year currently smoking OR have quit w/in 15years.) does not qualify.   Additional Screening:  Hepatitis C Screening: does not qualify; Completed 07/11/20  Vision Screening: Recommended annual ophthalmology exams for early detection of glaucoma and other disorders of the eye. Is the patient up to date with their annual eye exam?  Yes  Who is the provider or what is the name of the office in which the patient attends annual eye exams? DR.NEWMAN IN [place] If pt is not established with a provider, would they like to be referred to a provider to establish care? No .   Dental Screening: Recommended annual dental exams  for proper oral hygiene   Community Resource Referral / Chronic Care Management: CRR required this visit?  No   CCM required this visit?  No     Plan:     I have personally reviewed and noted the following in the patient's chart:   Medical and social history Use of alcohol, tobacco or illicit drugs  Current medications and supplements including opioid prescriptions. Patient is not currently taking opioid prescriptions. Functional ability and status Nutritional status Physical activity Advanced directives List of other  physicians Hospitalizations, surgeries, and ER visits in previous 12 months Vitals Screenings to include cognitive, depression, and falls Referrals and appointments  In addition, I have reviewed and discussed with patient certain preventive protocols, quality metrics, and best practice recommendations. A written personalized care plan for preventive services as well as general preventive health recommendations were provided to patient.     Kristen GORMAN Blanchard, Kristen Blanchard   02/[birth year]   After Visit Summary: (MyChart) Due to this being a telephonic visit, the after visit summary with patients personalized plan was offered to patient via MyChart   Nurse Notes: NONE

## 2023-08-07 DIAGNOSIS — Z95810 Presence of automatic (implantable) cardiac defibrillator: Secondary | ICD-10-CM | POA: Diagnosis not present

## 2023-08-07 DIAGNOSIS — I255 Ischemic cardiomyopathy: Secondary | ICD-10-CM | POA: Diagnosis not present

## 2023-08-07 DIAGNOSIS — I5022 Chronic systolic (congestive) heart failure: Secondary | ICD-10-CM | POA: Diagnosis not present

## 2023-08-07 DIAGNOSIS — I2510 Atherosclerotic heart disease of native coronary artery without angina pectoris: Secondary | ICD-10-CM | POA: Diagnosis not present

## 2023-08-07 DIAGNOSIS — R002 Palpitations: Secondary | ICD-10-CM | POA: Diagnosis not present

## 2023-08-07 DIAGNOSIS — R42 Dizziness and giddiness: Secondary | ICD-10-CM | POA: Diagnosis not present

## 2023-09-07 DIAGNOSIS — Z452 Encounter for adjustment and management of vascular access device: Secondary | ICD-10-CM | POA: Diagnosis not present
